# Patient Record
Sex: Female | Born: 1965
Health system: Southern US, Community
[De-identification: ages and names within clinical notes are randomized; demographics above are authoritative.]

## PROBLEM LIST (undated history)

## (undated) DIAGNOSIS — I8390 Asymptomatic varicose veins of unspecified lower extremity: Secondary | ICD-10-CM

## (undated) DIAGNOSIS — G43909 Migraine, unspecified, not intractable, without status migrainosus: Secondary | ICD-10-CM

## (undated) DIAGNOSIS — K219 Gastro-esophageal reflux disease without esophagitis: Secondary | ICD-10-CM

## (undated) DIAGNOSIS — K5792 Diverticulitis of intestine, part unspecified, without perforation or abscess without bleeding: Secondary | ICD-10-CM

## (undated) DIAGNOSIS — L130 Dermatitis herpetiformis: Secondary | ICD-10-CM

## (undated) DIAGNOSIS — J3489 Other specified disorders of nose and nasal sinuses: Secondary | ICD-10-CM

## (undated) DIAGNOSIS — N200 Calculus of kidney: Secondary | ICD-10-CM

## (undated) DIAGNOSIS — J309 Allergic rhinitis, unspecified: Secondary | ICD-10-CM

## (undated) DIAGNOSIS — E559 Vitamin D deficiency, unspecified: Secondary | ICD-10-CM

## (undated) DIAGNOSIS — T7840XA Allergy, unspecified, initial encounter: Secondary | ICD-10-CM

## (undated) DIAGNOSIS — J45909 Unspecified asthma, uncomplicated: Secondary | ICD-10-CM

## (undated) DIAGNOSIS — M2550 Pain in unspecified joint: Secondary | ICD-10-CM

## (undated) DIAGNOSIS — K5790 Diverticulosis of intestine, part unspecified, without perforation or abscess without bleeding: Secondary | ICD-10-CM

## (undated) DIAGNOSIS — E785 Hyperlipidemia, unspecified: Secondary | ICD-10-CM

## (undated) DIAGNOSIS — K589 Irritable bowel syndrome without diarrhea: Secondary | ICD-10-CM

## (undated) DIAGNOSIS — J40 Bronchitis, not specified as acute or chronic: Secondary | ICD-10-CM

## (undated) DIAGNOSIS — B009 Herpesviral infection, unspecified: Secondary | ICD-10-CM

## (undated) HISTORY — DX: Vitamin D deficiency, unspecified: E55.9

## (undated) HISTORY — DX: Calculus of kidney: N20.0

## (undated) HISTORY — DX: Hyperlipidemia, unspecified: E78.5

## (undated) HISTORY — PX: TUBAL LIGATION: SHX77

## (undated) HISTORY — DX: Unspecified asthma, uncomplicated: J45.909

## (undated) HISTORY — DX: Asymptomatic varicose veins of unspecified lower extremity: I83.90

## (undated) HISTORY — DX: Allergy, unspecified, initial encounter: T78.40XA

## (undated) HISTORY — DX: Herpesviral infection, unspecified: B00.9

## (undated) HISTORY — DX: Pain in unspecified joint: M25.50

## (undated) HISTORY — DX: Gastro-esophageal reflux disease without esophagitis: K21.9

## (undated) HISTORY — DX: Other specified disorders of nose and nasal sinuses: J34.89

## (undated) HISTORY — DX: Bronchitis, not specified as acute or chronic: J40

## (undated) HISTORY — PX: BREAST BIOPSY: SHX20

## (undated) HISTORY — DX: Diverticulosis of intestine, part unspecified, without perforation or abscess without bleeding: K57.90

## (undated) HISTORY — DX: Irritable bowel syndrome, unspecified: K58.9

## (undated) HISTORY — DX: Dermatitis herpetiformis: L13.0

## (undated) HISTORY — DX: Allergic rhinitis, unspecified: J30.9

---

## 1999-12-19 ENCOUNTER — Inpatient Hospital Stay (HOSPITAL_COMMUNITY): Admission: AD | Admit: 1999-12-19 | Discharge: 1999-12-22 | Payer: Self-pay | Admitting: Obstetrics & Gynecology

## 2000-01-11 ENCOUNTER — Encounter: Admission: RE | Admit: 2000-01-11 | Discharge: 2000-04-10 | Payer: Self-pay | Admitting: Obstetrics & Gynecology

## 2000-03-18 ENCOUNTER — Other Ambulatory Visit: Admission: RE | Admit: 2000-03-18 | Discharge: 2000-03-18 | Payer: Self-pay | Admitting: *Deleted

## 2000-04-24 ENCOUNTER — Encounter: Admission: RE | Admit: 2000-04-24 | Discharge: 2000-07-02 | Payer: Self-pay | Admitting: Obstetrics & Gynecology

## 2001-04-15 ENCOUNTER — Other Ambulatory Visit: Admission: RE | Admit: 2001-04-15 | Discharge: 2001-04-15 | Payer: Self-pay | Admitting: Obstetrics & Gynecology

## 2004-04-13 ENCOUNTER — Ambulatory Visit: Payer: Self-pay | Admitting: Family Medicine

## 2004-06-09 ENCOUNTER — Ambulatory Visit: Payer: Self-pay | Admitting: Family Medicine

## 2004-09-26 ENCOUNTER — Other Ambulatory Visit: Admission: RE | Admit: 2004-09-26 | Discharge: 2004-09-26 | Payer: Self-pay | Admitting: Obstetrics & Gynecology

## 2004-10-18 ENCOUNTER — Encounter: Admission: RE | Admit: 2004-10-18 | Discharge: 2004-10-18 | Payer: Self-pay | Admitting: General Surgery

## 2005-02-20 ENCOUNTER — Ambulatory Visit: Payer: Self-pay | Admitting: Family Medicine

## 2005-03-13 ENCOUNTER — Ambulatory Visit: Payer: Self-pay | Admitting: Family Medicine

## 2009-02-03 HISTORY — PX: COLONOSCOPY: SHX174

## 2011-05-23 HISTORY — PX: ESOPHAGOGASTRODUODENOSCOPY: SHX1529

## 2012-03-22 ENCOUNTER — Encounter (HOSPITAL_COMMUNITY): Payer: Self-pay | Admitting: *Deleted

## 2012-03-22 ENCOUNTER — Emergency Department (HOSPITAL_COMMUNITY)
Admission: EM | Admit: 2012-03-22 | Discharge: 2012-03-22 | Disposition: A | Discharge status: Home / Self Care | Payer: Federal, State, Local not specified - PPO | Attending: Emergency Medicine | Admitting: Emergency Medicine

## 2012-03-22 DIAGNOSIS — Z79899 Other long term (current) drug therapy: Secondary | ICD-10-CM | POA: Insufficient documentation

## 2012-03-22 DIAGNOSIS — M5430 Sciatica, unspecified side: Secondary | ICD-10-CM | POA: Insufficient documentation

## 2012-03-22 DIAGNOSIS — M5431 Sciatica, right side: Secondary | ICD-10-CM

## 2012-03-22 DIAGNOSIS — Z8719 Personal history of other diseases of the digestive system: Secondary | ICD-10-CM | POA: Insufficient documentation

## 2012-03-22 DIAGNOSIS — Z8679 Personal history of other diseases of the circulatory system: Secondary | ICD-10-CM | POA: Insufficient documentation

## 2012-03-22 HISTORY — DX: Migraine, unspecified, not intractable, without status migrainosus: G43.909

## 2012-03-22 HISTORY — DX: Diverticulitis of intestine, part unspecified, without perforation or abscess without bleeding: K57.92

## 2012-03-22 MED ORDER — DIAZEPAM 5 MG PO TABS: 5.0000 mg | ORAL_TABLET | Freq: Three times a day (TID) | ORAL | Status: DC | PRN

## 2012-03-22 MED ORDER — DEXAMETHASONE 2 MG PO TABS
10.0000 mg | ORAL_TABLET | Freq: Once | ORAL | Status: AC
  Administered 2012-03-22: 10 mg via ORAL
  Filled 2012-03-22: qty 5

## 2012-03-22 MED ORDER — IBUPROFEN 400 MG PO TABS
600.0000 mg | ORAL_TABLET | Freq: Once | ORAL | Status: AC
  Administered 2012-03-22: 600 mg via ORAL
  Filled 2012-03-22: qty 1

## 2012-03-22 MED ORDER — HYDROMORPHONE HCL PF 1 MG/ML IJ SOLN
1.0000 mg | Freq: Once | INTRAMUSCULAR | Status: AC
  Administered 2012-03-22: 1 mg via INTRAMUSCULAR
  Filled 2012-03-22: qty 1

## 2012-03-22 MED ORDER — DIAZEPAM 5 MG PO TABS
5.0000 mg | ORAL_TABLET | Freq: Once | ORAL | Status: AC
  Administered 2012-03-22: 5 mg via ORAL
  Filled 2012-03-22: qty 1

## 2012-03-22 MED ORDER — NAPROXEN 500 MG PO TABS: 500.0000 mg | ORAL_TABLET | Freq: Two times a day (BID) | ORAL | Status: DC | PRN

## 2012-03-22 MED ORDER — OXYCODONE-ACETAMINOPHEN 5-325 MG PO TABS: 1.0000 | ORAL_TABLET | ORAL | Status: DC | PRN

## 2012-03-22 MED ORDER — METHYLPREDNISOLONE 4 MG PO KIT: PACK | ORAL | Status: DC

## 2012-03-22 NOTE — ED Provider Notes (Signed)
History  This chart was scribed for Raeford Razor, MD by Ladona Ridgel Day, ED scribe. This patient was seen in room TR07C/TR07C and the patient's care was started at 1014.   CSN: [id number]  Arrival date & time 03/22/12  1014   First MD Initiated Contact with Patient 03/22/12 1113      Chief Complaint  Patient presents with  . Back Pain   Patient is a 46 y.o. female presenting with back pain. The history is provided by the patient. No language interpreter was used.  Back Pain  This is a new problem. The current episode started more than 2 days ago. The problem occurs constantly. The problem has been gradually worsening. The pain is associated with no known injury (years ago had accident white water rafting but did not have back pain until now). The pain is present in the lumbar spine. The quality of the pain is described as shooting. The pain radiates to the right thigh. The pain is moderate. The symptoms are aggravated by certain positions. The pain is the same all the time. Associated symptoms include leg pain. Pertinent negatives include no fever, no abdominal pain, no bowel incontinence, no bladder incontinence and no weakness. Treatments tried: had PT on thursday which did not help, also strated flexeril and toradol without relief. The treatment provided no relief.    Past Medical History  Diagnosis Date  . Diverticulitis   . Migraine     No past surgical history on file.  History reviewed. No pertinent family history.  History  Substance Use Topics  . Smoking status: Never Smoker   . Smokeless tobacco: Not on file  . Alcohol Use: No    OB History    Grav Para Term Preterm Abortions TAB SAB Ect Mult Living                  Review of Systems  Constitutional: Negative for fever and chills.  HENT: Negative for neck pain.   Respiratory: Negative for shortness of breath.   Gastrointestinal: Negative for nausea, vomiting, abdominal pain and bowel incontinence.  Genitourinary:  Negative for bladder incontinence.  Musculoskeletal: Positive for back pain (lumbar back pain which radiates down her right leg, worse with walking).  Skin: Negative for color change.  Neurological: Negative for weakness.  All other systems reviewed and are negative.    Allergies  Penicillins  Home Medications   Current Outpatient Rx  Name  Route  Sig  Dispense  Refill  . CETIRIZINE HCL 10 MG PO TABS   Oral   Take 10 mg by mouth daily.         . CHOLECALCIFEROL 400 UNITS PO TABS   Oral   Take 400 Units by mouth daily.         . CYCLOBENZAPRINE HCL 5 MG PO TABS   Oral   Take 5 mg by mouth daily as needed. For pain         . ESOMEPRAZOLE MAGNESIUM 40 MG PO CPDR   Oral   Take 40 mg by mouth daily before breakfast.         . KETOROLAC TROMETHAMINE 10 MG PO TABS   Oral   Take 10 mg by mouth every 6 (six) hours as needed. For pain         . MAGNESIUM GLUCONATE 500 MG PO TABS   Oral   Take 500 mg by mouth daily.         Marland Kitchen PROBIOTIC DAILY PO  Oral   Take 1 capsule by mouth daily.           Triage vitals: BP 149/75  Pulse 77  Temp 98.3 F (36.8 C)  Resp 22  SpO2 100%  Physical Exam  Nursing note and vitals reviewed. Constitutional: She appears well-developed and well-nourished. No distress.  HENT:  Head: Normocephalic and atraumatic.  Eyes: Conjunctivae normal are normal. Right eye exhibits no discharge. Left eye exhibits no discharge.  Neck: Neck supple.  Cardiovascular: Normal rate, regular rhythm and normal heart sounds.  Exam reveals no gallop and no friction rub.   No murmur heard. Pulmonary/Chest: Effort normal and breath sounds normal. No respiratory distress.  Abdominal: Soft. She exhibits no distension. There is no tenderness.  Musculoskeletal: She exhibits tenderness. She exhibits no edema.       No midline spinal tenderness. Left lumbar paraspinal tenderness along with buttock and left posterior lateral thigh. Positive SLR, NVI  distally.   Neurological: She is alert. She displays normal reflexes.  Skin: Skin is warm and dry.  Psychiatric: She has a normal mood and affect. Her behavior is normal. Thought content normal.    ED Course  Procedures (including critical care time) DIAGNOSTIC STUDIES: Oxygen Saturation is 100% on room air, normal by my interpretation.    COORDINATION OF CARE: At 1135 AM Discussed treatment plan with patient which includes pain medicine and valium. Patient agrees.   Labs Reviewed - No data to display No results found.   1. Sciatica of right side       MDM  46yF with back pain. No concerning "red flags." Nonfocal neuro exam. No indication for emergent imaging. Plan symptomatic tx. Return precautions discussed. Outpt fu.  I personally preformed the services scribed in my presence. The recorded information has been reviewed and is accurate. Raeford Razor, MD.         Raeford Razor, MD 03/22/12 [phone]

## 2012-03-22 NOTE — ED Notes (Signed)
Patient with lower back pain that started on Sunday and has progressively gotten worse.  Pain radiates to the right side of her leg.

## 2012-03-22 NOTE — ED Notes (Signed)
PTreports back pain to be 10/10 since Thursday. Pt's PCP started meds on Thursday flexeril ,toradol and PCP gave PT a steroid  Injection.

## 2015-09-27 DIAGNOSIS — K080 Exfoliation of teeth due to systemic causes: Secondary | ICD-10-CM | POA: Diagnosis not present

## 2015-10-04 DIAGNOSIS — H40003 Preglaucoma, unspecified, bilateral: Secondary | ICD-10-CM | POA: Diagnosis not present

## 2015-10-04 DIAGNOSIS — H524 Presbyopia: Secondary | ICD-10-CM | POA: Diagnosis not present

## 2015-10-07 DIAGNOSIS — E669 Obesity, unspecified: Secondary | ICD-10-CM | POA: Diagnosis not present

## 2015-10-07 DIAGNOSIS — Z23 Encounter for immunization: Secondary | ICD-10-CM | POA: Diagnosis not present

## 2015-10-07 DIAGNOSIS — Z0000 Encounter for general adult medical examination without abnormal findings: Secondary | ICD-10-CM | POA: Diagnosis not present

## 2015-10-07 DIAGNOSIS — Z1212 Encounter for screening for malignant neoplasm of rectum: Secondary | ICD-10-CM | POA: Diagnosis not present

## 2015-10-07 DIAGNOSIS — E559 Vitamin D deficiency, unspecified: Secondary | ICD-10-CM | POA: Diagnosis not present

## 2015-10-07 DIAGNOSIS — Z1211 Encounter for screening for malignant neoplasm of colon: Secondary | ICD-10-CM | POA: Diagnosis not present

## 2015-10-18 DIAGNOSIS — G43909 Migraine, unspecified, not intractable, without status migrainosus: Secondary | ICD-10-CM | POA: Diagnosis not present

## 2015-10-18 DIAGNOSIS — E559 Vitamin D deficiency, unspecified: Secondary | ICD-10-CM | POA: Diagnosis not present

## 2015-10-18 DIAGNOSIS — E785 Hyperlipidemia, unspecified: Secondary | ICD-10-CM | POA: Diagnosis not present

## 2015-10-18 DIAGNOSIS — E669 Obesity, unspecified: Secondary | ICD-10-CM | POA: Diagnosis not present

## 2015-12-24 DIAGNOSIS — G43909 Migraine, unspecified, not intractable, without status migrainosus: Secondary | ICD-10-CM | POA: Diagnosis not present

## 2016-02-15 DIAGNOSIS — Z1231 Encounter for screening mammogram for malignant neoplasm of breast: Secondary | ICD-10-CM | POA: Diagnosis not present

## 2016-02-15 DIAGNOSIS — M5416 Radiculopathy, lumbar region: Secondary | ICD-10-CM | POA: Diagnosis not present

## 2016-02-15 DIAGNOSIS — M5126 Other intervertebral disc displacement, lumbar region: Secondary | ICD-10-CM | POA: Diagnosis not present

## 2016-02-15 DIAGNOSIS — Z6832 Body mass index (BMI) 32.0-32.9, adult: Secondary | ICD-10-CM | POA: Diagnosis not present

## 2016-02-17 DIAGNOSIS — M5136 Other intervertebral disc degeneration, lumbar region: Secondary | ICD-10-CM | POA: Diagnosis not present

## 2016-02-17 DIAGNOSIS — M5126 Other intervertebral disc displacement, lumbar region: Secondary | ICD-10-CM | POA: Diagnosis not present

## 2016-02-17 DIAGNOSIS — E785 Hyperlipidemia, unspecified: Secondary | ICD-10-CM | POA: Diagnosis not present

## 2016-02-17 DIAGNOSIS — Z87898 Personal history of other specified conditions: Secondary | ICD-10-CM | POA: Diagnosis not present

## 2016-02-17 DIAGNOSIS — E559 Vitamin D deficiency, unspecified: Secondary | ICD-10-CM | POA: Diagnosis not present

## 2016-02-17 DIAGNOSIS — E669 Obesity, unspecified: Secondary | ICD-10-CM | POA: Diagnosis not present

## 2016-02-21 DIAGNOSIS — M5126 Other intervertebral disc displacement, lumbar region: Secondary | ICD-10-CM | POA: Diagnosis not present

## 2016-03-12 DIAGNOSIS — H40003 Preglaucoma, unspecified, bilateral: Secondary | ICD-10-CM | POA: Diagnosis not present

## 2016-04-03 DIAGNOSIS — M5126 Other intervertebral disc displacement, lumbar region: Secondary | ICD-10-CM | POA: Diagnosis not present

## 2016-04-04 DIAGNOSIS — Z79899 Other long term (current) drug therapy: Secondary | ICD-10-CM | POA: Diagnosis not present

## 2016-04-04 DIAGNOSIS — K080 Exfoliation of teeth due to systemic causes: Secondary | ICD-10-CM | POA: Diagnosis not present

## 2016-04-04 DIAGNOSIS — Z23 Encounter for immunization: Secondary | ICD-10-CM | POA: Diagnosis not present

## 2016-05-11 DIAGNOSIS — J039 Acute tonsillitis, unspecified: Secondary | ICD-10-CM | POA: Diagnosis not present

## 2016-05-11 DIAGNOSIS — J029 Acute pharyngitis, unspecified: Secondary | ICD-10-CM | POA: Diagnosis not present

## 2016-05-11 DIAGNOSIS — R05 Cough: Secondary | ICD-10-CM | POA: Diagnosis not present

## 2016-05-11 DIAGNOSIS — J209 Acute bronchitis, unspecified: Secondary | ICD-10-CM | POA: Diagnosis not present

## 2016-05-31 DIAGNOSIS — Z79899 Other long term (current) drug therapy: Secondary | ICD-10-CM | POA: Diagnosis not present

## 2016-05-31 DIAGNOSIS — E559 Vitamin D deficiency, unspecified: Secondary | ICD-10-CM | POA: Diagnosis not present

## 2016-05-31 DIAGNOSIS — E785 Hyperlipidemia, unspecified: Secondary | ICD-10-CM | POA: Diagnosis not present

## 2016-05-31 DIAGNOSIS — J301 Allergic rhinitis due to pollen: Secondary | ICD-10-CM | POA: Diagnosis not present

## 2016-05-31 DIAGNOSIS — G43909 Migraine, unspecified, not intractable, without status migrainosus: Secondary | ICD-10-CM | POA: Diagnosis not present

## 2016-09-12 ENCOUNTER — Encounter: Payer: Self-pay | Admitting: Sports Medicine

## 2016-09-12 ENCOUNTER — Ambulatory Visit (INDEPENDENT_AMBULATORY_CARE_PROVIDER_SITE_OTHER): Payer: Federal, State, Local not specified - PPO

## 2016-09-12 ENCOUNTER — Ambulatory Visit (INDEPENDENT_AMBULATORY_CARE_PROVIDER_SITE_OTHER): Payer: Federal, State, Local not specified - PPO | Admitting: Sports Medicine

## 2016-09-12 DIAGNOSIS — M722 Plantar fascial fibromatosis: Secondary | ICD-10-CM

## 2016-09-12 DIAGNOSIS — R52 Pain, unspecified: Secondary | ICD-10-CM

## 2016-09-12 MED ORDER — TRIAMCINOLONE ACETONIDE 10 MG/ML IJ SUSP: 10.0000 mg | Freq: Once | INTRAMUSCULAR | Status: AC

## 2016-09-12 MED ORDER — METHYLPREDNISOLONE 4 MG PO TBPK: ORAL_TABLET | ORAL | 0 refills | Status: DC

## 2016-09-12 NOTE — Progress Notes (Signed)
Subjective: Brittany Thomas is a 51 y.o. female patient presents to office with complaint of heel pain on the right. Patient admits to post static dyskinesia for 2 weeks in duration. Patient has treated this problem with cushions, changing shoes, stretching with no relief. Admits to a past history of achilles tendonitis on right. Denies any other pedal complaints.   WORKS AT [employer] OFF[employer].  There are no active problems to display for this patient.   Current Outpatient Prescriptions on File Prior to Visit  Medication Sig Dispense Refill  . cetirizine (ZYRTEC) 10 MG tablet Take 10 mg by mouth daily.    . cholecalciferol (VITAMIN D) 400 UNITS TABS Take 400 Units by mouth daily.    . cyclobenzaprine (FLEXERIL) 5 MG tablet Take 5 mg by mouth daily as needed. For pain    . diazepam (VALIUM) 5 MG tablet Take 1 tablet (5 mg total) by mouth every 8 (eight) hours as needed (muscle spasm). (Patient not taking: Reported on 09/12/2016) 12 tablet 0  . esomeprazole (NEXIUM) 40 MG capsule Take 40 mg by mouth daily before breakfast.    . ketorolac (TORADOL) 10 MG tablet Take 10 mg by mouth every 6 (six) hours as needed. For pain    . magnesium gluconate (MAGONATE) 500 MG tablet Take 500 mg by mouth daily.    . naproxen (NAPROSYN) 500 MG tablet Take 1 tablet (500 mg total) by mouth 2 (two) times daily as needed. (Patient not taking: Reported on 09/12/2016) 30 tablet 0  . oxyCODONE-acetaminophen (PERCOCET/ROXICET) 5-325 MG per tablet Take 1-2 tablets by mouth every 4 (four) hours as needed for pain. (Patient not taking: Reported on 09/12/2016) 12 tablet 0  . Probiotic Product (PROBIOTIC DAILY PO) Take 1 capsule by mouth daily.     No current facility-administered medications on file prior to visit.     Allergies  Allergen Reactions  . Penicillins     rash    Objective: Physical Exam General: The patient is alert and oriented x3 in no acute distress.  Dermatology: Skin is warm, dry and supple bilateral lower  extremities. Nails 1-10 are normal. There is no erythema, edema, no eccymosis, no open lesions present. Integument is otherwise unremarkable.  Vascular: Dorsalis Pedis pulse and Posterior Tibial pulse are 2/4 bilateral. Capillary fill time is immediate to all digits.  Neurological: Grossly intact to light touch with an achilles reflex of +2/5 and a  negative Tinel's sign bilateral.  Musculoskeletal: Tenderness to palpation at the medial calcaneal tubercale and through the insertion of the plantar fascia on the right foot. No pain with compression of calcaneus bilateral. No pain with tuning fork to calcaneus bilateral. No pain with calf compression bilateral. There is decreased Ankle joint range of motion bilateral. All other joints range of motion within normal limits bilateral. Strength 5/5 in all groups bilateral.   Gait: Unassisted, Antalgic avoid weight on right heel  Xray, Right foot:  Normal osseous mineralization. Joint spaces preserved. No fracture/dislocation/boney destruction. Calcaneal spur present with mild thickening of plantar fascia. No other soft tissue abnormalities or radiopaque foreign bodies.   Assessment and Plan: Problem List Items Addressed This Visit    None    Visit Diagnoses    Plantar fasciitis, right    -  Primary   Relevant Medications   triamcinolone acetonide (KENALOG) 10 MG/ML injection 10 mg   Pain       Relevant Orders   DG Foot Complete Right      -Complete examination  performed.  -Xrays reviewed -Discussed with patient in detail the condition of plantar fasciitis, how this occurs and general treatment options. Explained both conservative and surgical treatments.  -After oral consent and aseptic prep, injected a mixture containing 1 ml of 2%  plain lidocaine, 1 ml 0.5% plain marcaine, 0.5 ml of kenalog 10 and 0.5 ml of dexamethasone phosphate into right heel. Post-injection care discussed with patient.  -Rx Medrol dose pack to take as instructed   -May continue with Tylenol with pain since has difficulty with tolerating other NSAIDS -Recommended good supportive shoes and advised use of OTC insert. Explained to patient that if these orthoses work well, we will continue with these. If these do not improve her condition and  pain, we will consider custom molded orthoses. - Explained in detail the heel lift dispensed for the right foot -Explained and dispensed to patient daily stretching exercises. -Recommend patient to ice affected area 1-2x daily. -Patient to return to office in 3 weeks for follow up or sooner if problems or questions arise.  Landis Martins, DPM

## 2016-09-12 NOTE — Progress Notes (Signed)
   Subjective:    Patient ID: Brittany Thomas, female    DOB: [birth year], 51 y.o.   MRN: [id number]  HPI   I have a knot on the arch of my right foot and just noticed it today and has been going on for about two weeks     Review of Systems  Neurological: Positive for headaches.  All other systems reviewed and are negative.      Objective:   Physical Exam        Assessment & Plan:

## 2016-10-03 ENCOUNTER — Ambulatory Visit: Payer: Federal, State, Local not specified - PPO | Admitting: Sports Medicine

## 2016-10-03 DIAGNOSIS — M722 Plantar fascial fibromatosis: Secondary | ICD-10-CM | POA: Diagnosis not present

## 2016-10-03 DIAGNOSIS — R52 Pain, unspecified: Secondary | ICD-10-CM | POA: Diagnosis not present

## 2016-10-03 MED ORDER — METHYLPREDNISOLONE 4 MG PO TBPK: ORAL_TABLET | ORAL | 0 refills | Status: DC

## 2016-10-03 NOTE — Progress Notes (Signed)
Subjective: Brittany Thomas is a 51 y.o. female returns to office for follow up evaluation after Right heel injection for plantar fasciitis, injection #1 administered 3 weeks ago. Patient states that the injection seems to help her pain for about 1 week, but now pain is back to where it was. Patient denies any recent changes in medications or other new problems since last visit.  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   Current Outpatient Prescriptions on File Prior to Visit  Medication Sig Dispense Refill  . acetaminophen (TYLENOL) 500 MG tablet Take 500 mg by mouth every 6 (six) hours as needed.    . cetirizine (ZYRTEC) 10 MG tablet Take 10 mg by mouth daily.    . cholecalciferol (VITAMIN D) 400 UNITS TABS Take 400 Units by mouth daily.    . cyclobenzaprine (FLEXERIL) 5 MG tablet Take 5 mg by mouth daily as needed. For pain    . diazepam (VALIUM) 5 MG tablet Take 1 tablet (5 mg total) by mouth every 8 (eight) hours as needed (muscle spasm). (Patient not taking: Reported on 09/12/2016) 12 tablet 0  . esomeprazole (NEXIUM) 40 MG capsule Take 40 mg by mouth daily before breakfast.    . ketorolac (TORADOL) 10 MG tablet Take 10 mg by mouth every 6 (six) hours as needed. For pain    . loratadine (CLARITIN) 10 MG tablet Take 10 mg by mouth daily.    . magnesium gluconate (MAGONATE) 500 MG tablet Take 500 mg by mouth daily.    . montelukast (SINGULAIR) 10 MG tablet     . naproxen (NAPROSYN) 500 MG tablet Take 1 tablet (500 mg total) by mouth 2 (two) times daily as needed. (Patient not taking: Reported on 09/12/2016) 30 tablet 0  . oxyCODONE-acetaminophen (PERCOCET/ROXICET) 5-325 MG per tablet Take 1-2 tablets by mouth every 4 (four) hours as needed for pain. (Patient not taking: Reported on 09/12/2016) 12 tablet 0  . Probiotic Product (PROBIOTIC DAILY PO) Take 1 capsule by mouth daily.    . propranolol (INDERAL) 60 MG tablet Take  60 mg by mouth 3 (three) times daily.     Current Facility-Administered Medications on File Prior to Visit  Medication Dose Route Frequency Provider Last Rate Last Dose  . triamcinolone acetonide (KENALOG) 10 MG/ML injection 10 mg  10 mg Other Once Landis Martins, DPM        Allergies  Allergen Reactions  . Penicillins     rash    Objective:   General:  Alert and oriented x 3, in no acute distress  Dermatology: Skin is warm, dry, and supple bilateral. Nails are within normal limits. There is no lower extremity erythema, no eccymosis, no open lesions present bilateral.   Vascular: Dorsalis Pedis and Posterior Tibial pedal pulses are 2/4 bilateral. + hair growth noted bilateral. Capillary Fill Time is 3 seconds in all digits. No varicosities, No edema bilateral lower extremities.   Neurological: Sensation grossly intact to light touch with an achilles reflex of +2 and a  negative Tinel's sign bilateral. Vibratory, sharp/dull, Semmes Weinstein Monofilament within normal limits.   Musculoskeletal: There is Moderate tenderness to palpation at the medial calcaneal tubercale and through the insertion of the plantar fascia on the right foot and now compensation lateral ankle pain. No pain with compression to calcaneus or application of tuning fork. There is decreased Ankle joint range of motion bilateral. All other jointsrange  of motion  within normal limits bilateral. Strength 5/5 bilateral.   Assessment and Plan: Problem List Items Addressed This Visit    None    Visit Diagnoses    Plantar fasciitis, right    -  Primary   Relevant Medications   methylPREDNISolone (MEDROL DOSEPAK) 4 MG TBPK tablet   Pain       Relevant Medications   methylPREDNISolone (MEDROL DOSEPAK) 4 MG TBPK tablet      -Complete examination performed.  -Previous x-rays reviewed. -Discussed with patient in detail the condition of plantar fasciitis, how this  occurs related to the foot type of the patient and  general treatment options. - Patient refused a second injection today and refused CAM boot -Dispensed right fascial brace -Rx again Medrol Dosepak -Continue with stretching, icing, good supportive shoes, inserts daily.  -Discussed long term care and reocurrence; will closely monitor; if fails to improve will consider other treatment modalities.  -Patient to return to office in 2 weeks for follow up or sooner if problems or questions arise.  Landis Martins, DPM

## 2016-10-09 DIAGNOSIS — K080 Exfoliation of teeth due to systemic causes: Secondary | ICD-10-CM | POA: Diagnosis not present

## 2016-11-02 ENCOUNTER — Ambulatory Visit: Payer: Federal, State, Local not specified - PPO | Admitting: Sports Medicine

## 2016-11-26 DIAGNOSIS — E669 Obesity, unspecified: Secondary | ICD-10-CM | POA: Diagnosis not present

## 2016-11-26 DIAGNOSIS — Z1211 Encounter for screening for malignant neoplasm of colon: Secondary | ICD-10-CM | POA: Diagnosis not present

## 2016-11-26 DIAGNOSIS — E559 Vitamin D deficiency, unspecified: Secondary | ICD-10-CM | POA: Diagnosis not present

## 2016-11-26 DIAGNOSIS — Z1389 Encounter for screening for other disorder: Secondary | ICD-10-CM | POA: Diagnosis not present

## 2016-11-26 DIAGNOSIS — Z1212 Encounter for screening for malignant neoplasm of rectum: Secondary | ICD-10-CM | POA: Diagnosis not present

## 2016-11-26 DIAGNOSIS — Z0000 Encounter for general adult medical examination without abnormal findings: Secondary | ICD-10-CM | POA: Diagnosis not present

## 2017-01-02 DIAGNOSIS — H40003 Preglaucoma, unspecified, bilateral: Secondary | ICD-10-CM | POA: Diagnosis not present

## 2017-01-02 DIAGNOSIS — Z124 Encounter for screening for malignant neoplasm of cervix: Secondary | ICD-10-CM | POA: Diagnosis not present

## 2017-01-02 DIAGNOSIS — L900 Lichen sclerosus et atrophicus: Secondary | ICD-10-CM | POA: Diagnosis not present

## 2017-01-02 DIAGNOSIS — Z6832 Body mass index (BMI) 32.0-32.9, adult: Secondary | ICD-10-CM | POA: Diagnosis not present

## 2017-01-02 DIAGNOSIS — L292 Pruritus vulvae: Secondary | ICD-10-CM | POA: Diagnosis not present

## 2017-01-02 DIAGNOSIS — Z01411 Encounter for gynecological examination (general) (routine) with abnormal findings: Secondary | ICD-10-CM | POA: Diagnosis not present

## 2017-01-02 DIAGNOSIS — Z1231 Encounter for screening mammogram for malignant neoplasm of breast: Secondary | ICD-10-CM | POA: Diagnosis not present

## 2017-02-25 DIAGNOSIS — M722 Plantar fascial fibromatosis: Secondary | ICD-10-CM | POA: Diagnosis not present

## 2017-02-25 DIAGNOSIS — Z23 Encounter for immunization: Secondary | ICD-10-CM | POA: Diagnosis not present

## 2017-03-08 DIAGNOSIS — M25571 Pain in right ankle and joints of right foot: Secondary | ICD-10-CM | POA: Diagnosis not present

## 2017-03-08 DIAGNOSIS — M722 Plantar fascial fibromatosis: Secondary | ICD-10-CM | POA: Diagnosis not present

## 2017-03-12 DIAGNOSIS — M722 Plantar fascial fibromatosis: Secondary | ICD-10-CM | POA: Diagnosis not present

## 2017-03-12 DIAGNOSIS — M25571 Pain in right ankle and joints of right foot: Secondary | ICD-10-CM | POA: Diagnosis not present

## 2017-03-15 DIAGNOSIS — M25571 Pain in right ankle and joints of right foot: Secondary | ICD-10-CM | POA: Diagnosis not present

## 2017-03-15 DIAGNOSIS — M722 Plantar fascial fibromatosis: Secondary | ICD-10-CM | POA: Diagnosis not present

## 2017-03-18 DIAGNOSIS — M25571 Pain in right ankle and joints of right foot: Secondary | ICD-10-CM | POA: Diagnosis not present

## 2017-03-18 DIAGNOSIS — M722 Plantar fascial fibromatosis: Secondary | ICD-10-CM | POA: Diagnosis not present

## 2017-03-22 DIAGNOSIS — M25571 Pain in right ankle and joints of right foot: Secondary | ICD-10-CM | POA: Diagnosis not present

## 2017-03-22 DIAGNOSIS — M722 Plantar fascial fibromatosis: Secondary | ICD-10-CM | POA: Diagnosis not present

## 2017-05-09 DIAGNOSIS — K080 Exfoliation of teeth due to systemic causes: Secondary | ICD-10-CM | POA: Diagnosis not present

## 2017-07-02 DIAGNOSIS — G43909 Migraine, unspecified, not intractable, without status migrainosus: Secondary | ICD-10-CM | POA: Diagnosis not present

## 2017-07-02 DIAGNOSIS — E559 Vitamin D deficiency, unspecified: Secondary | ICD-10-CM | POA: Diagnosis not present

## 2017-07-02 DIAGNOSIS — E785 Hyperlipidemia, unspecified: Secondary | ICD-10-CM | POA: Diagnosis not present

## 2017-11-12 DIAGNOSIS — K080 Exfoliation of teeth due to systemic causes: Secondary | ICD-10-CM | POA: Diagnosis not present

## 2017-11-26 DIAGNOSIS — L237 Allergic contact dermatitis due to plants, except food: Secondary | ICD-10-CM | POA: Diagnosis not present

## 2017-11-26 DIAGNOSIS — L299 Pruritus, unspecified: Secondary | ICD-10-CM | POA: Diagnosis not present

## 2017-12-30 DIAGNOSIS — Z1331 Encounter for screening for depression: Secondary | ICD-10-CM | POA: Diagnosis not present

## 2017-12-30 DIAGNOSIS — E559 Vitamin D deficiency, unspecified: Secondary | ICD-10-CM | POA: Diagnosis not present

## 2017-12-30 DIAGNOSIS — G43909 Migraine, unspecified, not intractable, without status migrainosus: Secondary | ICD-10-CM | POA: Diagnosis not present

## 2017-12-30 DIAGNOSIS — E669 Obesity, unspecified: Secondary | ICD-10-CM | POA: Diagnosis not present

## 2017-12-30 DIAGNOSIS — E785 Hyperlipidemia, unspecified: Secondary | ICD-10-CM | POA: Diagnosis not present

## 2018-01-15 DIAGNOSIS — Z6832 Body mass index (BMI) 32.0-32.9, adult: Secondary | ICD-10-CM | POA: Diagnosis not present

## 2018-01-15 DIAGNOSIS — Z1231 Encounter for screening mammogram for malignant neoplasm of breast: Secondary | ICD-10-CM | POA: Diagnosis not present

## 2018-01-15 DIAGNOSIS — Z01419 Encounter for gynecological examination (general) (routine) without abnormal findings: Secondary | ICD-10-CM | POA: Diagnosis not present

## 2018-01-29 DIAGNOSIS — R51 Headache: Secondary | ICD-10-CM | POA: Diagnosis not present

## 2018-02-17 DIAGNOSIS — H52222 Regular astigmatism, left eye: Secondary | ICD-10-CM | POA: Diagnosis not present

## 2018-02-17 DIAGNOSIS — H40003 Preglaucoma, unspecified, bilateral: Secondary | ICD-10-CM | POA: Diagnosis not present

## 2018-02-17 DIAGNOSIS — H5213 Myopia, bilateral: Secondary | ICD-10-CM | POA: Diagnosis not present

## 2018-04-18 ENCOUNTER — Ambulatory Visit: Payer: Federal, State, Local not specified - PPO | Admitting: Sports Medicine

## 2018-04-18 ENCOUNTER — Encounter: Payer: Self-pay | Admitting: Sports Medicine

## 2018-04-18 ENCOUNTER — Ambulatory Visit (INDEPENDENT_AMBULATORY_CARE_PROVIDER_SITE_OTHER): Payer: Federal, State, Local not specified - PPO

## 2018-04-18 DIAGNOSIS — M7732 Calcaneal spur, left foot: Secondary | ICD-10-CM

## 2018-04-18 DIAGNOSIS — M79672 Pain in left foot: Secondary | ICD-10-CM

## 2018-04-18 DIAGNOSIS — M7662 Achilles tendinitis, left leg: Secondary | ICD-10-CM

## 2018-04-18 MED ORDER — DICLOFENAC EPOLAMINE 1.3 % TD PTCH: MEDICATED_PATCH | TRANSDERMAL | 1 refills | Status: DC

## 2018-04-18 NOTE — Progress Notes (Signed)
Subjective: Brittany Thomas is a 52 y.o. female patient who presents to office for evaluation of left heel pain. Patient complains of progressive pain especially over the last 6 months at the back of her heel states the pain is sharp constant pain difficult to rest her heel down pain is 7 out of 10 states the pain is worse with standing all day and now that she has returned back to work after being out for 2 weeks helping to take care of her father who has moved in with her.  Patient has tried Tylenol with no relief in symptoms. Patient denies any other pedal complaints.   There are no active problems to display for this patient.   Current Outpatient Medications on File Prior to Visit  Medication Sig Dispense Refill  . acetaminophen (TYLENOL) 500 MG tablet Take 500 mg by mouth every 6 (six) hours as needed.    Marland Kitchen amitriptyline (ELAVIL) 10 MG tablet amitriptyline 10 mg tablet    . cetirizine (ZYRTEC) 10 MG tablet Take 10 mg by mouth daily.    . cholecalciferol (VITAMIN D) 400 UNITS TABS Take 400 Units by mouth daily.    . cyclobenzaprine (FLEXERIL) 5 MG tablet Take 5 mg by mouth daily as needed. For pain    . diazepam (VALIUM) 5 MG tablet Take 1 tablet (5 mg total) by mouth every 8 (eight) hours as needed (muscle spasm). 12 tablet 0  . esomeprazole (NEXIUM) 40 MG capsule Take 40 mg by mouth daily before breakfast.    . gabapentin (NEURONTIN) 100 MG capsule gabapentin 100 mg capsule    . ibuprofen (ADVIL,MOTRIN) 600 MG tablet ibuprofen 600 mg tablet    . ketorolac (TORADOL) 10 MG tablet Take 10 mg by mouth every 6 (six) hours as needed. For pain    . loratadine (CLARITIN) 10 MG tablet Take 10 mg by mouth daily.    . magnesium gluconate (MAGONATE) 500 MG tablet Take 500 mg by mouth daily.    . meloxicam (MOBIC) 7.5 MG tablet meloxicam 7.5 mg tablet    . methylPREDNISolone (MEDROL DOSEPAK) 4 MG TBPK tablet Take as instructed. 21 tablet 0  . metroNIDAZOLE (FLAGYL) 500 MG tablet metronidazole 500 mg  tablet    . montelukast (SINGULAIR) 10 MG tablet     . naproxen (NAPROSYN) 500 MG tablet Take 1 tablet (500 mg total) by mouth 2 (two) times daily as needed. 30 tablet 0  . oxyCODONE-acetaminophen (PERCOCET/ROXICET) 5-325 MG per tablet Take 1-2 tablets by mouth every 4 (four) hours as needed for pain. 12 tablet 0  . Probiotic Product (PROBIOTIC DAILY PO) Take 1 capsule by mouth daily.    . promethazine (PHENERGAN) 25 MG tablet promethazine 25 mg tablet    . propranolol (INDERAL) 60 MG tablet Take 60 mg by mouth 3 (three) times daily.    . SUMAtriptan (IMITREX) 100 MG tablet sumatriptan 100 mg tablet    . triamcinolone cream (KENALOG) 0.1 % triamcinolone acetonide 0.1 % topical cream    . valACYclovir (VALTREX) 1000 MG tablet valacyclovir 1 gram tablet     Current Facility-Administered Medications on File Prior to Visit  Medication Dose Route Frequency Provider Last Rate Last Dose  . triamcinolone acetonide (KENALOG) 10 MG/ML injection 10 mg  10 mg Other Once Landis Martins, DPM        Allergies  Allergen Reactions  . Penicillins     rash    Objective:  General: Alert and oriented x3 in no acute distress  Dermatology: No open lesions bilateral lower extremities, no webspace macerations, no ecchymosis bilateral, all nails x 10 are well manicured.  Vascular: Dorsalis Pedis and Posterior Tibial pedal pulses 2/4, Capillary Fill Time 3 seconds, + pedal hair growth bilateral, no edema bilateral lower extremities, Temperature gradient within normal limits.  Neurology: Johney Maine sensation intact via light touch bilateral.  Musculoskeletal: Moderate tenderness with palpation at insertion of the Achilles on left, there is calcaneal exostosis with mild soft tissue present and decreased ankle rom with knee extending  vs flexed resembling gastroc equnius bilateral, The achilles tendon feels intact with no nodularity or palpable dell, Thompson sign negative.  Gait: Antalgic gait with increased heel  off left  Xrays  Left Foot    Impression: Normal osseous mineralization. Joint spaces preserved. No fracture/dislocation/boney destruction.  Significant partially fractured calcaneal spur present. Kager's triangle intact with no obliteration. No soft tissue abnormalities or radiopaque foreign bodies.   Assessment and Plan: Problem List Items Addressed This Visit    None    Visit Diagnoses    Tendonitis, Achilles, left    -  Primary   Relevant Medications   diclofenac (FLECTOR) 1.3 % PTCH   Left foot pain       Relevant Orders   DG Foot Complete Left   Heel spur, left          -Complete examination performed -Xrays reviewed -Discussed treatement options for Achilles tendinitis with heel spur -Patient declined cam boot at this visit and declined oral medications (Medrol Dosepak and Mobic) -Prescribed topical Flector patch for patient to use as instructed -Recommend rest ice elevation and to wear Achilles sleeve when in shoes to prevent rubbing to the back of her heel with heel lifts as I provided at today's visit -No improvement will consider MRI/PT/EPAT -Patient to return to office if symptoms are not better within 2 weeks or sooner if condition worsens.  Advised patient that she may also call as well if she changes her mind about the oral medications for me to send to her pharmacy.  Landis Martins, DPM

## 2018-04-25 ENCOUNTER — Other Ambulatory Visit: Payer: Self-pay | Admitting: Sports Medicine

## 2018-04-25 DIAGNOSIS — M7662 Achilles tendinitis, left leg: Secondary | ICD-10-CM

## 2018-04-25 DIAGNOSIS — M79672 Pain in left foot: Secondary | ICD-10-CM

## 2018-04-25 DIAGNOSIS — M7732 Calcaneal spur, left foot: Secondary | ICD-10-CM

## 2018-05-21 DIAGNOSIS — K080 Exfoliation of teeth due to systemic causes: Secondary | ICD-10-CM | POA: Diagnosis not present

## 2018-07-01 DIAGNOSIS — E785 Hyperlipidemia, unspecified: Secondary | ICD-10-CM | POA: Diagnosis not present

## 2018-07-01 DIAGNOSIS — J329 Chronic sinusitis, unspecified: Secondary | ICD-10-CM | POA: Diagnosis not present

## 2018-07-01 DIAGNOSIS — G43909 Migraine, unspecified, not intractable, without status migrainosus: Secondary | ICD-10-CM | POA: Diagnosis not present

## 2018-07-01 DIAGNOSIS — E559 Vitamin D deficiency, unspecified: Secondary | ICD-10-CM | POA: Diagnosis not present

## 2018-07-01 DIAGNOSIS — Z131 Encounter for screening for diabetes mellitus: Secondary | ICD-10-CM | POA: Diagnosis not present

## 2018-07-15 ENCOUNTER — Encounter: Payer: Self-pay | Admitting: Family Medicine

## 2018-09-24 DIAGNOSIS — N390 Urinary tract infection, site not specified: Secondary | ICD-10-CM | POA: Diagnosis not present

## 2018-11-11 DIAGNOSIS — H40023 Open angle with borderline findings, high risk, bilateral: Secondary | ICD-10-CM | POA: Diagnosis not present

## 2018-12-17 DIAGNOSIS — M25572 Pain in left ankle and joints of left foot: Secondary | ICD-10-CM | POA: Diagnosis not present

## 2018-12-17 DIAGNOSIS — M25562 Pain in left knee: Secondary | ICD-10-CM | POA: Diagnosis not present

## 2018-12-19 DIAGNOSIS — B009 Herpesviral infection, unspecified: Secondary | ICD-10-CM | POA: Diagnosis not present

## 2018-12-19 DIAGNOSIS — G43909 Migraine, unspecified, not intractable, without status migrainosus: Secondary | ICD-10-CM | POA: Diagnosis not present

## 2018-12-19 DIAGNOSIS — M25569 Pain in unspecified knee: Secondary | ICD-10-CM | POA: Diagnosis not present

## 2018-12-19 DIAGNOSIS — M7660 Achilles tendinitis, unspecified leg: Secondary | ICD-10-CM | POA: Diagnosis not present

## 2019-01-07 DIAGNOSIS — E559 Vitamin D deficiency, unspecified: Secondary | ICD-10-CM | POA: Diagnosis not present

## 2019-01-07 DIAGNOSIS — E785 Hyperlipidemia, unspecified: Secondary | ICD-10-CM | POA: Diagnosis not present

## 2019-01-14 DIAGNOSIS — E559 Vitamin D deficiency, unspecified: Secondary | ICD-10-CM | POA: Diagnosis not present

## 2019-01-14 DIAGNOSIS — M25569 Pain in unspecified knee: Secondary | ICD-10-CM | POA: Diagnosis not present

## 2019-01-14 DIAGNOSIS — M25562 Pain in left knee: Secondary | ICD-10-CM | POA: Diagnosis not present

## 2019-01-14 DIAGNOSIS — G43909 Migraine, unspecified, not intractable, without status migrainosus: Secondary | ICD-10-CM | POA: Diagnosis not present

## 2019-01-14 DIAGNOSIS — E782 Mixed hyperlipidemia: Secondary | ICD-10-CM | POA: Diagnosis not present

## 2019-02-11 DIAGNOSIS — M25562 Pain in left knee: Secondary | ICD-10-CM | POA: Diagnosis not present

## 2019-02-19 DIAGNOSIS — Z1231 Encounter for screening mammogram for malignant neoplasm of breast: Secondary | ICD-10-CM | POA: Diagnosis not present

## 2019-02-19 DIAGNOSIS — Z01419 Encounter for gynecological examination (general) (routine) without abnormal findings: Secondary | ICD-10-CM | POA: Diagnosis not present

## 2019-02-19 DIAGNOSIS — H40023 Open angle with borderline findings, high risk, bilateral: Secondary | ICD-10-CM | POA: Diagnosis not present

## 2019-02-19 DIAGNOSIS — H5213 Myopia, bilateral: Secondary | ICD-10-CM | POA: Diagnosis not present

## 2019-02-19 DIAGNOSIS — Z6832 Body mass index (BMI) 32.0-32.9, adult: Secondary | ICD-10-CM | POA: Diagnosis not present

## 2019-06-01 DIAGNOSIS — Z20828 Contact with and (suspected) exposure to other viral communicable diseases: Secondary | ICD-10-CM | POA: Diagnosis not present

## 2019-06-01 DIAGNOSIS — J329 Chronic sinusitis, unspecified: Secondary | ICD-10-CM | POA: Diagnosis not present

## 2019-06-01 DIAGNOSIS — B9689 Other specified bacterial agents as the cause of diseases classified elsewhere: Secondary | ICD-10-CM | POA: Diagnosis not present

## 2019-06-01 DIAGNOSIS — R0981 Nasal congestion: Secondary | ICD-10-CM | POA: Diagnosis not present

## 2019-06-09 DIAGNOSIS — Z20828 Contact with and (suspected) exposure to other viral communicable diseases: Secondary | ICD-10-CM | POA: Diagnosis not present

## 2019-06-09 DIAGNOSIS — R519 Headache, unspecified: Secondary | ICD-10-CM | POA: Diagnosis not present

## 2019-06-09 DIAGNOSIS — J019 Acute sinusitis, unspecified: Secondary | ICD-10-CM | POA: Diagnosis not present

## 2019-06-09 DIAGNOSIS — R509 Fever, unspecified: Secondary | ICD-10-CM | POA: Diagnosis not present

## 2019-06-19 DIAGNOSIS — J988 Other specified respiratory disorders: Secondary | ICD-10-CM | POA: Diagnosis not present

## 2019-06-19 DIAGNOSIS — U071 COVID-19: Secondary | ICD-10-CM | POA: Diagnosis not present

## 2019-06-29 DIAGNOSIS — J988 Other specified respiratory disorders: Secondary | ICD-10-CM | POA: Diagnosis not present

## 2019-06-29 DIAGNOSIS — Z6830 Body mass index (BMI) 30.0-30.9, adult: Secondary | ICD-10-CM | POA: Diagnosis not present

## 2019-06-29 DIAGNOSIS — F4321 Adjustment disorder with depressed mood: Secondary | ICD-10-CM | POA: Diagnosis not present

## 2019-06-29 DIAGNOSIS — U071 COVID-19: Secondary | ICD-10-CM | POA: Diagnosis not present

## 2019-07-01 DIAGNOSIS — H04123 Dry eye syndrome of bilateral lacrimal glands: Secondary | ICD-10-CM | POA: Diagnosis not present

## 2019-07-02 DIAGNOSIS — J988 Other specified respiratory disorders: Secondary | ICD-10-CM | POA: Diagnosis not present

## 2019-07-02 DIAGNOSIS — Z79899 Other long term (current) drug therapy: Secondary | ICD-10-CM | POA: Diagnosis not present

## 2019-07-02 DIAGNOSIS — Z6830 Body mass index (BMI) 30.0-30.9, adult: Secondary | ICD-10-CM | POA: Diagnosis not present

## 2019-07-02 DIAGNOSIS — F4321 Adjustment disorder with depressed mood: Secondary | ICD-10-CM | POA: Diagnosis not present

## 2019-07-30 DIAGNOSIS — J309 Allergic rhinitis, unspecified: Secondary | ICD-10-CM | POA: Diagnosis not present

## 2019-07-30 DIAGNOSIS — F4321 Adjustment disorder with depressed mood: Secondary | ICD-10-CM | POA: Diagnosis not present

## 2019-07-30 DIAGNOSIS — F5102 Adjustment insomnia: Secondary | ICD-10-CM | POA: Diagnosis not present

## 2019-07-30 DIAGNOSIS — R0982 Postnasal drip: Secondary | ICD-10-CM | POA: Diagnosis not present

## 2019-09-10 DIAGNOSIS — E559 Vitamin D deficiency, unspecified: Secondary | ICD-10-CM | POA: Diagnosis not present

## 2019-09-10 DIAGNOSIS — E782 Mixed hyperlipidemia: Secondary | ICD-10-CM | POA: Diagnosis not present

## 2019-09-16 DIAGNOSIS — Z79899 Other long term (current) drug therapy: Secondary | ICD-10-CM | POA: Diagnosis not present

## 2019-09-16 DIAGNOSIS — J309 Allergic rhinitis, unspecified: Secondary | ICD-10-CM | POA: Diagnosis not present

## 2019-09-16 DIAGNOSIS — E782 Mixed hyperlipidemia: Secondary | ICD-10-CM | POA: Diagnosis not present

## 2019-09-16 DIAGNOSIS — R635 Abnormal weight gain: Secondary | ICD-10-CM | POA: Diagnosis not present

## 2019-09-16 DIAGNOSIS — R0982 Postnasal drip: Secondary | ICD-10-CM | POA: Diagnosis not present

## 2019-09-16 DIAGNOSIS — F5102 Adjustment insomnia: Secondary | ICD-10-CM | POA: Diagnosis not present

## 2019-10-16 DIAGNOSIS — F4321 Adjustment disorder with depressed mood: Secondary | ICD-10-CM | POA: Diagnosis not present

## 2019-11-17 DIAGNOSIS — J309 Allergic rhinitis, unspecified: Secondary | ICD-10-CM | POA: Diagnosis not present

## 2019-11-17 DIAGNOSIS — R0982 Postnasal drip: Secondary | ICD-10-CM | POA: Diagnosis not present

## 2019-11-17 DIAGNOSIS — J45909 Unspecified asthma, uncomplicated: Secondary | ICD-10-CM | POA: Diagnosis not present

## 2019-11-17 DIAGNOSIS — R05 Cough: Secondary | ICD-10-CM | POA: Diagnosis not present

## 2019-12-09 DIAGNOSIS — J309 Allergic rhinitis, unspecified: Secondary | ICD-10-CM | POA: Diagnosis not present

## 2019-12-09 DIAGNOSIS — G43909 Migraine, unspecified, not intractable, without status migrainosus: Secondary | ICD-10-CM | POA: Diagnosis not present

## 2019-12-09 DIAGNOSIS — J45909 Unspecified asthma, uncomplicated: Secondary | ICD-10-CM | POA: Diagnosis not present

## 2019-12-09 DIAGNOSIS — R0982 Postnasal drip: Secondary | ICD-10-CM | POA: Diagnosis not present

## 2019-12-23 DIAGNOSIS — Z6831 Body mass index (BMI) 31.0-31.9, adult: Secondary | ICD-10-CM | POA: Diagnosis not present

## 2019-12-23 DIAGNOSIS — J45909 Unspecified asthma, uncomplicated: Secondary | ICD-10-CM | POA: Diagnosis not present

## 2019-12-23 DIAGNOSIS — J309 Allergic rhinitis, unspecified: Secondary | ICD-10-CM | POA: Diagnosis not present

## 2019-12-23 DIAGNOSIS — R0982 Postnasal drip: Secondary | ICD-10-CM | POA: Diagnosis not present

## 2020-03-17 DIAGNOSIS — Z1231 Encounter for screening mammogram for malignant neoplasm of breast: Secondary | ICD-10-CM | POA: Diagnosis not present

## 2020-03-17 DIAGNOSIS — Z6833 Body mass index (BMI) 33.0-33.9, adult: Secondary | ICD-10-CM | POA: Diagnosis not present

## 2020-03-17 DIAGNOSIS — J45909 Unspecified asthma, uncomplicated: Secondary | ICD-10-CM | POA: Diagnosis not present

## 2020-03-17 DIAGNOSIS — Z23 Encounter for immunization: Secondary | ICD-10-CM | POA: Diagnosis not present

## 2020-03-17 DIAGNOSIS — L280 Lichen simplex chronicus: Secondary | ICD-10-CM | POA: Diagnosis not present

## 2020-03-17 DIAGNOSIS — Z01419 Encounter for gynecological examination (general) (routine) without abnormal findings: Secondary | ICD-10-CM | POA: Diagnosis not present

## 2020-03-17 DIAGNOSIS — E559 Vitamin D deficiency, unspecified: Secondary | ICD-10-CM | POA: Diagnosis not present

## 2020-03-17 DIAGNOSIS — Z1331 Encounter for screening for depression: Secondary | ICD-10-CM | POA: Diagnosis not present

## 2020-03-17 DIAGNOSIS — E782 Mixed hyperlipidemia: Secondary | ICD-10-CM | POA: Diagnosis not present

## 2020-04-06 DIAGNOSIS — Z6831 Body mass index (BMI) 31.0-31.9, adult: Secondary | ICD-10-CM | POA: Diagnosis not present

## 2020-04-06 DIAGNOSIS — J019 Acute sinusitis, unspecified: Secondary | ICD-10-CM | POA: Diagnosis not present

## 2020-04-23 DIAGNOSIS — J00 Acute nasopharyngitis [common cold]: Secondary | ICD-10-CM | POA: Diagnosis not present

## 2020-05-25 DIAGNOSIS — J0110 Acute frontal sinusitis, unspecified: Secondary | ICD-10-CM | POA: Diagnosis not present

## 2020-05-25 DIAGNOSIS — R0981 Nasal congestion: Secondary | ICD-10-CM | POA: Diagnosis not present

## 2020-09-13 DIAGNOSIS — F5102 Adjustment insomnia: Secondary | ICD-10-CM | POA: Diagnosis not present

## 2020-09-13 DIAGNOSIS — J309 Allergic rhinitis, unspecified: Secondary | ICD-10-CM | POA: Diagnosis not present

## 2020-09-13 DIAGNOSIS — R0982 Postnasal drip: Secondary | ICD-10-CM | POA: Diagnosis not present

## 2020-09-13 DIAGNOSIS — Z13220 Encounter for screening for lipoid disorders: Secondary | ICD-10-CM | POA: Diagnosis not present

## 2020-09-13 DIAGNOSIS — Z0000 Encounter for general adult medical examination without abnormal findings: Secondary | ICD-10-CM | POA: Diagnosis not present

## 2020-09-13 DIAGNOSIS — Z131 Encounter for screening for diabetes mellitus: Secondary | ICD-10-CM | POA: Diagnosis not present

## 2020-12-02 DIAGNOSIS — K5792 Diverticulitis of intestine, part unspecified, without perforation or abscess without bleeding: Secondary | ICD-10-CM | POA: Diagnosis not present

## 2020-12-02 DIAGNOSIS — Z6831 Body mass index (BMI) 31.0-31.9, adult: Secondary | ICD-10-CM | POA: Diagnosis not present

## 2020-12-05 DIAGNOSIS — Z7952 Long term (current) use of systemic steroids: Secondary | ICD-10-CM | POA: Diagnosis not present

## 2020-12-05 DIAGNOSIS — R1011 Right upper quadrant pain: Secondary | ICD-10-CM | POA: Diagnosis not present

## 2020-12-05 DIAGNOSIS — R1031 Right lower quadrant pain: Secondary | ICD-10-CM | POA: Diagnosis not present

## 2020-12-05 DIAGNOSIS — Z6831 Body mass index (BMI) 31.0-31.9, adult: Secondary | ICD-10-CM | POA: Diagnosis not present

## 2020-12-05 DIAGNOSIS — K5792 Diverticulitis of intestine, part unspecified, without perforation or abscess without bleeding: Secondary | ICD-10-CM | POA: Diagnosis not present

## 2020-12-05 DIAGNOSIS — K219 Gastro-esophageal reflux disease without esophagitis: Secondary | ICD-10-CM | POA: Diagnosis not present

## 2020-12-05 DIAGNOSIS — Z79899 Other long term (current) drug therapy: Secondary | ICD-10-CM | POA: Diagnosis not present

## 2021-01-04 ENCOUNTER — Other Ambulatory Visit: Payer: Self-pay

## 2021-01-04 ENCOUNTER — Ambulatory Visit (INDEPENDENT_AMBULATORY_CARE_PROVIDER_SITE_OTHER): Payer: Federal, State, Local not specified - PPO | Admitting: Gastroenterology

## 2021-01-04 VITALS — BP 122/78 | HR 65 | Ht 63.0 in | Wt 186.2 lb

## 2021-01-04 DIAGNOSIS — K219 Gastro-esophageal reflux disease without esophagitis: Secondary | ICD-10-CM

## 2021-01-04 DIAGNOSIS — K222 Esophageal obstruction: Secondary | ICD-10-CM

## 2021-01-04 DIAGNOSIS — Z1211 Encounter for screening for malignant neoplasm of colon: Secondary | ICD-10-CM

## 2021-01-04 MED ORDER — PANTOPRAZOLE SODIUM 20 MG PO TBEC: 20.0000 mg | DELAYED_RELEASE_TABLET | Freq: Every day | ORAL | 4 refills | Status: DC

## 2021-01-04 NOTE — Patient Instructions (Signed)
You have been scheduled for an endoscopy and colonoscopy. Please follow the written instructions given to you at your visit today. Please pick up your prep supplies at the pharmacy within the next 1-3 days. If you use inhalers (even only as needed), please bring them with you on the day of your procedure.(Sample of Clenpiq given to you today)  We have sent the following medications to your pharmacy for you to pick up at your convenience: Protonix  If you are age 57 or younger, your body mass index should be between 19-25. Your Body mass index is 32.99 kg/m. If this is out of the aformentioned range listed, please consider follow up with your Primary Care Provider.   __________________________________________________________  The [facility] providers would like to encourage you to use [facility] to communicate with providers for non-urgent requests or questions.  Due to long hold times on the telephone, sending your provider a message by [facility] may be a faster and more efficient way to get a response.  Please allow 48 business hours for a response.  Please remember that this is for non-urgent requests.   Thank you,  Dr. Jackquline Denmark

## 2021-01-04 NOTE — Progress Notes (Signed)
Chief Complaint: GI evaluation.  Referring Provider:  Melony Overly, MD      ASSESSMENT AND PLAN;   #1. GERD with H/O eso stricture, now with recurrent dysphagia.  #2. Colorectal cancer screening  #3. R sided pain (likely musculoskeletal-resolved), neg CT AP 2022 (reviewed with pt)  Plan: -EGD with dil/colon -Protonix '20mg'$  po qd #90, 4 refills.   I discussed EGD/Colonoscopy- the indications, risks, alternatives and potential complications including, but not limited to, bleeding, infection, reaction to medication, damage to internal organs, cardiac and/or pulmonary problems, and perforation requiring surgery (1 to 2 in 1000). The possibility that significant findings could be missed was explained. All ? were answered. The patient gives consent to proceed. HPI:    Brittany Thomas is a 55 y.o. female   C/O dysphagia x 1 yr, getting worse, mostly solids, with water as well, mid chest with associated heartburn.  She has stopped taking Nexium on her own.  As it was not working.  She denies having any odynophagia.  No melena or weight loss.  Had an episode of right-sided flank and lower pain after helping her son to move to [employer].  He was initially thought to be diverticulitis.  She was given a trial of Cipro/Flagyl.  CT scan Abdo/pelvis with p.o. and IV contrast was negative.  It was thought that she has musculoskeletal pain.  She was treated symptomatically and has gotten better.  She is due for screening colonoscopy.  Denies having any diarrhea or constipation.  Has 3 Bms/day with probiotics.  No fever chills or night sweats.  No jaundice dark urine or pale stools.  No sodas, chocolates, chewing gums, artificial sweeteners and candy. No NSAIDs  She is taking [facility] dental work-up.   Previous GI work-up: Barium swallow 05/2011: Hold-up of the barium tablet in the distal esophagus with circumferential narrowing consistent with esophageal  stricture.  EGD 05/2011: -Distal esophageal stricture s/p eso balloon dilatation (50Fr) -Incidental gastric polyps.  Fundic gland polyps -Neg esophageal biopsies for EoE. + Bx for reflux.  Colonoscopy 01/2009: ([facility]) Mild pancolonic diverticulosis.  Neg random colon bx for microscopic colitis.  CT AP with contrast August 2022 -No acute intra-abdominal or intrapelvic process. Normal appendix.     SH-works for [place] [employer], daughter works for [employer], son (21)for Conservator, museum/gallery in [place]   Past Medical History:  Diagnosis Date   Allergic rhinitis    Arthralgia of multiple sites    Bronchitis    Calcium nephrolithiasis    Dermatitis herpetiformis    Diverticulitis    Diverticulitis    Diverticulosis    GERD (gastroesophageal reflux disease)    Hyperlipidemia    IBS (irritable bowel syndrome)    Migraine    Recurrent herpes simplex    Rhinorrhea    Superficial varicosities    Right   Vitamin D deficiency     Past Surgical History:  Procedure Laterality Date   BREAST BIOPSY     CESAREAN SECTION     x2   COLONOSCOPY  02/03/2009   Mild pancolonic diverticulosis. Otherwise normal colonoscopy   ESOPHAGOGASTRODUODENOSCOPY  05/23/2011   Distal esophageal stricture, status post esophageal dilatation. Incidental gastic polyps (status post polypectomy previously deemed to be fundic gland polyps). Mild gastroduodenitis.   TUBAL LIGATION Bilateral     No family history on file.  Social History   Tobacco Use   Smoking status: Never   Smokeless tobacco: Never  Substance Use Topics   Alcohol  use: No    Current Outpatient Medications  Medication Sig Dispense Refill   acetaminophen (TYLENOL) 500 MG tablet Take 500 mg by mouth every 6 (six) hours as needed.     cetirizine (ZYRTEC) 10 MG tablet Take 10 mg by mouth daily.     cholecalciferol (VITAMIN D) 400 UNITS TABS Take 400 Units by mouth daily.     clindamycin (CLEOCIN) 300 MG capsule Take 300 mg by mouth every 8 (eight)  hours.     cyclobenzaprine (FLEXERIL) 5 MG tablet Take 5 mg by mouth daily as needed. For pain     ibuprofen (ADVIL,MOTRIN) 600 MG tablet ibuprofen 600 mg tablet     lidocaine (LIDODERM) 5 % 1 patch daily as needed.     loratadine (CLARITIN) 10 MG tablet Take 10 mg by mouth daily.     metroNIDAZOLE (FLAGYL) 500 MG tablet metronidazole 500 mg tablet     Probiotic Product (PROBIOTIC DAILY PO) Take 1 capsule by mouth daily.     propranolol (INDERAL) 60 MG tablet Take 60 mg by mouth 3 (three) times daily.     valACYclovir (VALTREX) 1000 MG tablet valacyclovir 1 gram tablet     Current Facility-Administered Medications  Medication Dose Route Frequency Provider Last Rate Last Admin   triamcinolone acetonide (KENALOG) 10 MG/ML injection 10 mg  10 mg Other Once Landis Martins, DPM        Allergies  Allergen Reactions   Penicillins     rash    Review of Systems:  Constitutional: Denies fever, chills, diaphoresis, appetite change and fatigue.  HEENT: Denies photophobia, eye pain, redness, hearing loss, ear pain, congestion, sore throat, rhinorrhea, sneezing, mouth sores, neck pain, neck stiffness and tinnitus.   Respiratory: Denies SOB, DOE, cough, chest tightness,  and wheezing.   Cardiovascular: Denies chest pain, palpitations and leg swelling.  Genitourinary: Denies dysuria, urgency, frequency, hematuria, flank pain and difficulty urinating.  Musculoskeletal: Denies myalgias, back pain, joint swelling, arthralgias and gait problem.  Skin: No rash.  Neurological: Denies dizziness, seizures, syncope, weakness, light-headedness, numbness and headaches.  Hematological: Denies adenopathy. Easy bruising, personal or family bleeding history  Psychiatric/Behavioral: No anxiety or depression     Physical Exam:    BP 122/78   Pulse 65   Ht '5\' 3"'$  (1.6 m)   Wt 186 lb 4 oz (84.5 kg)   SpO2 98%   BMI 32.99 kg/m  Wt Readings from Last 3 Encounters:  01/04/21 186 lb 4 oz (84.5 kg)    Constitutional:  Well-developed, in no acute distress. Psychiatric: Normal mood and affect. Behavior is normal. HEENT: Pupils normal.  Conjunctivae are normal. No scleral icterus. Cardiovascular: Normal rate, regular rhythm. No edema Pulmonary/chest: Effort normal and breath sounds normal. No wheezing, rales or rhonchi. Abdominal: Soft, nondistended. Nontender. Bowel sounds active throughout. There are no masses palpable. No hepatomegaly. Rectal: Deferred Neurological: Alert and oriented to person place and time. Skin: Skin is warm and dry. No rashes noted.    Carmell Austria, MD 01/04/2021, 11:10 AM  Cc: Melony Overly, MD

## 2021-01-11 DIAGNOSIS — K080 Exfoliation of teeth due to systemic causes: Secondary | ICD-10-CM | POA: Diagnosis not present

## 2021-03-16 ENCOUNTER — Other Ambulatory Visit: Payer: Self-pay

## 2021-03-16 ENCOUNTER — Encounter: Payer: Self-pay | Admitting: Gastroenterology

## 2021-03-16 ENCOUNTER — Ambulatory Visit (AMBULATORY_SURGERY_CENTER): Payer: Federal, State, Local not specified - PPO | Admitting: Gastroenterology

## 2021-03-16 VITALS — BP 121/67 | HR 67 | Temp 97.8°F | Resp 14 | Ht 63.0 in | Wt 186.0 lb

## 2021-03-16 DIAGNOSIS — K222 Esophageal obstruction: Secondary | ICD-10-CM | POA: Diagnosis not present

## 2021-03-16 DIAGNOSIS — Z1211 Encounter for screening for malignant neoplasm of colon: Secondary | ICD-10-CM | POA: Diagnosis not present

## 2021-03-16 DIAGNOSIS — K317 Polyp of stomach and duodenum: Secondary | ICD-10-CM | POA: Diagnosis not present

## 2021-03-16 DIAGNOSIS — D122 Benign neoplasm of ascending colon: Secondary | ICD-10-CM

## 2021-03-16 DIAGNOSIS — K219 Gastro-esophageal reflux disease without esophagitis: Secondary | ICD-10-CM

## 2021-03-16 DIAGNOSIS — D120 Benign neoplasm of cecum: Secondary | ICD-10-CM

## 2021-03-16 DIAGNOSIS — K635 Polyp of colon: Secondary | ICD-10-CM | POA: Diagnosis not present

## 2021-03-16 DIAGNOSIS — R1310 Dysphagia, unspecified: Secondary | ICD-10-CM | POA: Diagnosis not present

## 2021-03-16 MED ORDER — SODIUM CHLORIDE 0.9 % IV SOLN
500.0000 mL | Freq: Once | INTRAVENOUS | Status: DC
Start: 2021-03-16 — End: 2021-03-16

## 2021-03-16 NOTE — Op Note (Signed)
[facility] Patient Name: Brittany Thomas Procedure Date: 03/16/2021 10:01 AM MRN: [id number] Endoscopist: Jackquline Denmark , MD Age: 55 Referring MD:  Date of Birth: May 03, 1966 Gender: Female Account #: [id number] Procedure:                Colonoscopy Indications:              Screening for colorectal malignant neoplasm Medicines:                Monitored Anesthesia Care Procedure:                Pre-Anesthesia Assessment:                           - Prior to the procedure, a History and Physical                            was performed, and patient medications and                            allergies were reviewed. The patient's tolerance of                            previous anesthesia was also reviewed. The risks                            and benefits of the procedure and the sedation                            options and risks were discussed with the patient.                            All questions were answered, and informed consent                            was obtained. Prior Anticoagulants: The patient has                            taken no previous anticoagulant or antiplatelet                            agents. ASA Grade Assessment: II - A patient with                            mild systemic disease. After reviewing the risks                            and benefits, the patient was deemed in                            satisfactory condition to undergo the procedure.                           After obtaining informed consent, the colonoscope  was passed under direct vision. Throughout the                            procedure, the patient's blood pressure, pulse, and                            oxygen saturations were monitored continuously. The                            PCF-HQ190L Colonoscope was introduced through the                            anus and advanced to the the cecum, identified by                            appendiceal  orifice and ileocecal valve. The                            colonoscopy was performed without difficulty. The                            patient tolerated the procedure well. The quality                            of the bowel preparation was good. The ileocecal                            valve, appendiceal orifice, and rectum were                            photographed. Scope In: 10:27:24 AM Scope Out: 10:40:46 AM Scope Withdrawal Time: 0 hours 10 minutes 42 seconds  Total Procedure Duration: 0 hours 13 minutes 22 seconds  Findings:                 Two sessile polyps were found in the ascending                            colon and cecum. The polyps were 2 to 3 mm in size.                            These polyps were removed with a cold biopsy                            forceps. Resection and retrieval were complete.                           A few medium-mouthed diverticula were found in the                            sigmoid colon, ascending colon and cecum.                           Non-bleeding internal hemorrhoids were found during  retroflexion. The hemorrhoids were small and Grade                            I (internal hemorrhoids that do not prolapse).                           The exam was otherwise without abnormality on                            direct and retroflexion views. Complications:            No immediate complications. Estimated Blood Loss:     Estimated blood loss: none. Impression:               - Two 2 to 3 mm polyps in the ascending colon and                            in the cecum, removed with a cold biopsy forceps.                            Resected and retrieved.                           - Mild pancolonic diverticulosis.                           - Non-bleeding internal hemorrhoids.                           - The examination was otherwise normal on direct                            and retroflexion views. Recommendation:            - Patient has a contact number available for                            emergencies. The signs and symptoms of potential                            delayed complications were discussed with the                            patient. Return to normal activities tomorrow.                            Written discharge instructions were provided to the                            patient.                           - High fiber diet.                           - Continue present medications.                           -  Repeat colonoscopy depending upon the Bx results.                           - The findings and recommendations were discussed                            with the patient's family. Jackquline Denmark, MD 03/16/2021 10:59:23 AM This report has been signed electronically.

## 2021-03-16 NOTE — Progress Notes (Signed)
Called to room to assist during endoscopic procedure.  Patient ID and intended procedure confirmed with present staff. Received instructions for my participation in the procedure from the performing physician.  

## 2021-03-16 NOTE — Patient Instructions (Addendum)
Handouts were given to your care partner on esophageal dilatation diet the rest of today, polyps, diverticulosis, hemorrhoids, and a high fiber diet with liberal fluid intake. Continue taking PROTONIX 20 mg daily. You may resume your current medications today. Await biopsy results.  May take 1-3 weeks to receive pathology results. Please call if any questions or concerns.       YOU HAD AN ENDOSCOPIC PROCEDURE TODAY AT [facility]:   Refer to the procedure report that was given to you for any specific questions about what was found during the examination.  If the procedure report does not answer your questions, please call your gastroenterologist to clarify.  If you requested that your care partner not be given the details of your procedure findings, then the procedure report has been included in a sealed envelope for you to review at your convenience later.  YOU SHOULD EXPECT: Some feelings of bloating in the abdomen. Passage of more gas than usual.  Walking can help get rid of the air that was put into your GI tract during the procedure and reduce the bloating. If you had a lower endoscopy (such as a colonoscopy or flexible sigmoidoscopy) you may notice spotting of blood in your stool or on the toilet paper. If you underwent a bowel prep for your procedure, you may not have a normal bowel movement for a few days.  Please Note:  You might notice some irritation and congestion in your nose or some drainage.  This is from the oxygen used during your procedure.  There is no need for concern and it should clear up in a day or so.  SYMPTOMS TO REPORT IMMEDIATELY:  Following lower endoscopy (colonoscopy or flexible sigmoidoscopy):  Excessive amounts of blood in the stool  Significant tenderness or worsening of abdominal pains  Swelling of the abdomen that is new, acute  Fever of 100F or higher  Following upper endoscopy (EGD)  Vomiting of blood or coffee ground material  New  chest pain or pain under the shoulder blades  Painful or persistently difficult swallowing  New shortness of breath  Fever of 100F or higher  Black, tarry-looking stools  For urgent or emergent issues, a gastroenterologist can be reached at any hour by calling [phone]. Do not use MyChart messaging for urgent concerns.    DIET:  Please follow the esophageal dilatation diet the rest of today.  Handout was attached to you after visit summary.    Drink plenty of fluids but you should avoid alcoholic beverages for 24 hours.  ACTIVITY:  You should plan to take it easy for the rest of today and you should NOT DRIVE or use heavy machinery until tomorrow (because of the sedation medicines used during the test).    FOLLOW UP: Our staff will call the number listed on your records 48-72 hours following your procedure to check on you and address any questions or concerns that you may have regarding the information given to you following your procedure. If we do not reach you, we will leave a message.  We will attempt to reach you two times.  During this call, we will ask if you have developed any symptoms of COVID 19. If you develop any symptoms (ie: fever, flu-like symptoms, shortness of breath, cough etc.) before then, please call [phone].  If you test positive for Covid 19 in the 2 weeks post procedure, please call and report this information to [place].    If any biopsies were  taken you will be contacted by phone or by letter within the next 1-3 weeks.  Please call us at [phone] if you have not heard about the biopsies in 3 weeks.    SIGNATURES/CONFIDENTIALITY: You and/or your care partner have signed paperwork which will be entered into your electronic medical record.  These signatures attest to the fact that that the information above on your After Visit Summary has been reviewed and is understood.  Full responsibility of the confidentiality of this discharge information lies with you  and/or your care-partner.

## 2021-03-16 NOTE — Progress Notes (Signed)
Chief Complaint: GI evaluation.  Referring Provider:  Melony Overly, MD      ASSESSMENT AND PLAN;   #1. GERD with H/O eso stricture, now with recurrent dysphagia.  #2. Colorectal cancer screening  #3. R sided pain (likely musculoskeletal-resolved), neg CT AP 2022 (reviewed with pt)  Plan: -EGD with dil/colon -Protonix 20mg  po qd #90, 4 refills.   I discussed EGD/Colonoscopy- the indications, risks, alternatives and potential complications including, but not limited to, bleeding, infection, reaction to medication, damage to internal organs, cardiac and/or pulmonary problems, and perforation requiring surgery (1 to 2 in 1000). The possibility that significant findings could be missed was explained. All ? were answered. The patient gives consent to proceed. HPI:    Brittany Thomas is a 55 y.o. female   C/O dysphagia x 1 yr, getting worse, mostly solids, with water as well, mid chest with associated heartburn.  She has stopped taking Nexium on her own.  As it was not working.  She denies having any odynophagia.  No melena or weight loss.  Had an episode of right-sided flank and lower pain after helping her son to move to [employer].  He was initially thought to be diverticulitis.  She was given a trial of Cipro/Flagyl.  CT scan Abdo/pelvis with p.o. and IV contrast was negative.  It was thought that she has musculoskeletal pain.  She was treated symptomatically and has gotten better.  She is due for screening colonoscopy.  Denies having any diarrhea or constipation.  Has 3 Bms/day with probiotics.  No fever chills or night sweats.  No jaundice dark urine or pale stools.  No sodas, chocolates, chewing gums, artificial sweeteners and candy. No NSAIDs  She is taking [facility] dental work-up.   Previous GI work-up: Barium swallow 05/2011: Hold-up of the barium tablet in the distal esophagus with circumferential narrowing consistent with esophageal  stricture.  EGD 05/2011: -Distal esophageal stricture s/p eso balloon dilatation (50Fr) -Incidental gastric polyps.  Fundic gland polyps -Neg esophageal biopsies for EoE. + Bx for reflux.  Colonoscopy 01/2009: ([facility]) Mild pancolonic diverticulosis.  Neg random colon bx for microscopic colitis.  CT AP with contrast August 2022 -No acute intra-abdominal or intrapelvic process. Normal appendix.     SH-works for [place] [employer], daughter works for [employer], son (21)for Conservator, museum/gallery in [place]   Past Medical History:  Diagnosis Date   Allergic rhinitis    Allergy    Arthralgia of multiple sites    Asthma    Bronchitis    Calcium nephrolithiasis    Dermatitis herpetiformis    Diverticulitis    Diverticulitis    Diverticulosis    GERD (gastroesophageal reflux disease)    Hyperlipidemia    IBS (irritable bowel syndrome)    Migraine    Recurrent herpes simplex    Rhinorrhea    Superficial varicosities    Right   Vitamin D deficiency     Past Surgical History:  Procedure Laterality Date   BREAST BIOPSY     CESAREAN SECTION     x2   COLONOSCOPY  02/03/2009   Mild pancolonic diverticulosis. Otherwise normal colonoscopy   ESOPHAGOGASTRODUODENOSCOPY  05/23/2011   Distal esophageal stricture, status post esophageal dilatation. Incidental gastic polyps (status post polypectomy previously deemed to be fundic gland polyps). Mild gastroduodenitis.   TUBAL LIGATION Bilateral     History reviewed. No pertinent family history.  Social History   Tobacco Use   Smoking status: Never   Smokeless  tobacco: Never  Substance Use Topics   Alcohol use: No    Current Outpatient Medications  Medication Sig Dispense Refill   cetirizine (ZYRTEC) 10 MG tablet Take 10 mg by mouth daily.     cholecalciferol (VITAMIN D) 400 UNITS TABS Take 400 Units by mouth daily.     ibuprofen (ADVIL,MOTRIN) 600 MG tablet ibuprofen 600 mg tablet     pantoprazole (PROTONIX) 20 MG tablet Take 1 tablet (20 mg  total) by mouth daily. 90 tablet 4   Probiotic Product (PROBIOTIC DAILY PO) Take 1 capsule by mouth daily.     propranolol (INDERAL) 60 MG tablet Take 60 mg by mouth 3 (three) times daily.     acetaminophen (TYLENOL) 500 MG tablet Take 500 mg by mouth every 6 (six) hours as needed. (Patient not taking: Reported on 03/16/2021)     albuterol (VENTOLIN HFA) 108 (90 Base) MCG/ACT inhaler albuterol sulfate HFA 90 mcg/actuation aerosol inhaler     azelastine (ASTELIN) 0.1 % nasal spray azelastine 137 mcg (0.1 %) nasal spray aerosol (Patient not taking: Reported on 03/16/2021)     clindamycin (CLEOCIN) 300 MG capsule Take 300 mg by mouth every 8 (eight) hours. (Patient not taking: Reported on 03/16/2021)     cyclobenzaprine (FLEXERIL) 5 MG tablet Take 5 mg by mouth daily as needed. For pain (Patient not taking: Reported on 03/16/2021)     lidocaine (LIDODERM) 5 % 1 patch daily as needed. (Patient not taking: Reported on 03/16/2021)     loratadine (CLARITIN) 10 MG tablet Take 10 mg by mouth daily. (Patient not taking: Reported on 03/16/2021)     metroNIDAZOLE (FLAGYL) 500 MG tablet metronidazole 500 mg tablet (Patient not taking: Reported on 03/16/2021)     valACYclovir (VALTREX) 1000 MG tablet valacyclovir 1 gram tablet (Patient not taking: Reported on 03/16/2021)     Current Facility-Administered Medications  Medication Dose Route Frequency Provider Last Rate Last Admin   0.9 %  sodium chloride infusion  500 mL Intravenous Once Jackquline Denmark, MD       triamcinolone acetonide (KENALOG) 10 MG/ML injection 10 mg  10 mg Other Once Landis Martins, DPM        Allergies  Allergen Reactions   Penicillins     rash    Review of Systems:  Constitutional: Denies fever, chills, diaphoresis, appetite change and fatigue.  HEENT: Denies photophobia, eye pain, redness, hearing loss, ear pain, congestion, sore throat, rhinorrhea, sneezing, mouth sores, neck pain, neck stiffness and tinnitus.   Respiratory:  Denies SOB, DOE, cough, chest tightness,  and wheezing.   Cardiovascular: Denies chest pain, palpitations and leg swelling.  Genitourinary: Denies dysuria, urgency, frequency, hematuria, flank pain and difficulty urinating.  Musculoskeletal: Denies myalgias, back pain, joint swelling, arthralgias and gait problem.  Skin: No rash.  Neurological: Denies dizziness, seizures, syncope, weakness, light-headedness, numbness and headaches.  Hematological: Denies adenopathy. Easy bruising, personal or family bleeding history  Psychiatric/Behavioral: No anxiety or depression     Physical Exam:    BP 122/72   Pulse 62   Temp 97.8 F (36.6 C)   Ht 5\' 3"  (1.6 m)   Wt 186 lb (84.4 kg)   SpO2 100%   BMI 32.95 kg/m  Wt Readings from Last 3 Encounters:  03/16/21 186 lb (84.4 kg)  01/04/21 186 lb 4 oz (84.5 kg)   Constitutional:  Well-developed, in no acute distress. Psychiatric: Normal mood and affect. Behavior is normal. HEENT: Pupils normal.  Conjunctivae are normal. No scleral icterus. Cardiovascular:  Normal rate, regular rhythm. No edema Pulmonary/chest: Effort normal and breath sounds normal. No wheezing, rales or rhonchi. Abdominal: Soft, nondistended. Nontender. Bowel sounds active throughout. There are no masses palpable. No hepatomegaly. Rectal: Deferred Neurological: Alert and oriented to person place and time. Skin: Skin is warm and dry. No rashes noted.    Carmell Austria, MD 03/16/2021, 9:59 AM  Cc: Melony Overly, MD

## 2021-03-16 NOTE — Progress Notes (Signed)
No problems noted in the recovery room. maw 

## 2021-03-16 NOTE — Op Note (Signed)
[place] Patient Name: Brittany Thomas Procedure Date: 03/16/2021 10:02 AM MRN: [id number] Endoscopist: Jackquline Denmark , MD Age: 55 Referring MD:  Date of Birth: Aug 30, 1965 Gender: Female Account #: [id number] Procedure:                Upper GI endoscopy Indications:              Dysphagia. GERD Medicines:                Monitored Anesthesia Care Procedure:                Pre-Anesthesia Assessment:                           - Prior to the procedure, a History and Physical                            was performed, and patient medications and                            allergies were reviewed. The patient's tolerance of                            previous anesthesia was also reviewed. The risks                            and benefits of the procedure and the sedation                            options and risks were discussed with the patient.                            All questions were answered, and informed consent                            was obtained. Prior Anticoagulants: The patient has                            taken no previous anticoagulant or antiplatelet                            agents. ASA Grade Assessment: II - A patient with                            mild systemic disease. After reviewing the risks                            and benefits, the patient was deemed in                            satisfactory condition to undergo the procedure.                           After obtaining informed consent, the endoscope was  passed under direct vision. Throughout the                            procedure, the patient's blood pressure, pulse, and                            oxygen saturations were monitored continuously. The                            Endoscope was introduced through the mouth, and                            advanced to the second part of duodenum. The upper                            GI endoscopy was accomplished without  difficulty.                            The patient tolerated the procedure well. Scope In: Scope Out: Findings:                 One benign-appearing, intrinsic mild stenosis was                            found 38 cm from the incisors. This stenosis                            measured 1.3 cm (inner diameter). The stenosis was                            traversed. A TTS dilator was passed through the                            scope. Dilation with a 16-17-18 mm balloon dilator                            was performed to 17 mm. The dilation site was                            examined and showed mild mucosal disruption and                            complete resolution of luminal narrowing.                           A few 4 to 8 mm semi-sessile polyps with no                            bleeding and no stigmata of recent bleeding were                            found in the gastric fundus and in the gastric  body. Three polyps were removed with a hot snare.                            Resection and retrieval were complete.                           The exam of the stomach was otherwise normal.                           The examined duodenum was normal. Complications:            No immediate complications. Estimated Blood Loss:     Estimated blood loss: none. Impression:               - Benign-appearing esophageal stenosis. Dilated.                           - A few gastric polyps. Resected and retrievedm x 3                           - Normal examined duodenum. Recommendation:           - Patient has a contact number available for                            emergencies. The signs and symptoms of potential                            delayed complications were discussed with the                            patient. Return to normal activities tomorrow.                            Written discharge instructions were provided to the                            patient.                            - Post dilatation diet.                           - Continue present medications Including Protonix                            20 mg p.o. once a day.                           - Await pathology results.                           - If still with problems, will consider eso                            manometry.                           -  The findings and recommendations were discussed                            with the patient's family. Jackquline Denmark, MD 03/16/2021 10:53:40 AM This report has been signed electronically.

## 2021-03-16 NOTE — Progress Notes (Signed)
Report to PACU, RN, vss, BBS= Clear.  

## 2021-03-20 ENCOUNTER — Telehealth: Payer: Self-pay

## 2021-03-20 NOTE — Telephone Encounter (Signed)
No answer, left message to call back later today, B.Corbyn Steedman RN. 

## 2021-03-20 NOTE — Telephone Encounter (Signed)
Second attempt follow up call to pt, LM on VM ?

## 2021-03-21 ENCOUNTER — Encounter: Payer: Self-pay | Admitting: Gastroenterology

## 2021-03-28 DIAGNOSIS — J329 Chronic sinusitis, unspecified: Secondary | ICD-10-CM | POA: Diagnosis not present

## 2021-03-28 DIAGNOSIS — Z6831 Body mass index (BMI) 31.0-31.9, adult: Secondary | ICD-10-CM | POA: Diagnosis not present

## 2021-03-28 DIAGNOSIS — J40 Bronchitis, not specified as acute or chronic: Secondary | ICD-10-CM | POA: Diagnosis not present

## 2021-04-01 ENCOUNTER — Other Ambulatory Visit: Payer: Self-pay | Admitting: Gastroenterology

## 2021-04-03 ENCOUNTER — Telehealth: Payer: Self-pay

## 2021-04-03 NOTE — Telephone Encounter (Signed)
[employer] has been approved PA for Pantoprazole 20 MG from 03/04/21 through 04/03/2022.

## 2021-04-06 DIAGNOSIS — Z1231 Encounter for screening mammogram for malignant neoplasm of breast: Secondary | ICD-10-CM | POA: Diagnosis not present

## 2021-04-06 DIAGNOSIS — Z01419 Encounter for gynecological examination (general) (routine) without abnormal findings: Secondary | ICD-10-CM | POA: Diagnosis not present

## 2021-04-06 DIAGNOSIS — Z6832 Body mass index (BMI) 32.0-32.9, adult: Secondary | ICD-10-CM | POA: Diagnosis not present

## 2021-04-13 DIAGNOSIS — R0982 Postnasal drip: Secondary | ICD-10-CM | POA: Diagnosis not present

## 2021-04-13 DIAGNOSIS — M1712 Unilateral primary osteoarthritis, left knee: Secondary | ICD-10-CM | POA: Diagnosis not present

## 2021-04-13 DIAGNOSIS — K219 Gastro-esophageal reflux disease without esophagitis: Secondary | ICD-10-CM | POA: Diagnosis not present

## 2021-04-13 DIAGNOSIS — J309 Allergic rhinitis, unspecified: Secondary | ICD-10-CM | POA: Diagnosis not present

## 2021-06-16 DIAGNOSIS — Z6830 Body mass index (BMI) 30.0-30.9, adult: Secondary | ICD-10-CM | POA: Diagnosis not present

## 2021-06-16 DIAGNOSIS — M533 Sacrococcygeal disorders, not elsewhere classified: Secondary | ICD-10-CM | POA: Diagnosis not present

## 2021-06-16 DIAGNOSIS — M545 Low back pain, unspecified: Secondary | ICD-10-CM | POA: Diagnosis not present

## 2021-06-26 ENCOUNTER — Encounter (HOSPITAL_BASED_OUTPATIENT_CLINIC_OR_DEPARTMENT_OTHER): Payer: Self-pay

## 2021-06-26 ENCOUNTER — Other Ambulatory Visit (HOSPITAL_BASED_OUTPATIENT_CLINIC_OR_DEPARTMENT_OTHER): Payer: Self-pay | Admitting: Family Medicine

## 2021-06-26 ENCOUNTER — Ambulatory Visit (HOSPITAL_BASED_OUTPATIENT_CLINIC_OR_DEPARTMENT_OTHER)
Admission: RE | Admit: 2021-06-26 | Discharge: 2021-06-26 | Disposition: A | Discharge status: Home / Self Care | Payer: Federal, State, Local not specified - PPO | Source: Ambulatory Visit | Attending: Family Medicine | Admitting: Family Medicine

## 2021-06-26 ENCOUNTER — Other Ambulatory Visit: Payer: Self-pay

## 2021-06-26 DIAGNOSIS — Z6830 Body mass index (BMI) 30.0-30.9, adult: Secondary | ICD-10-CM | POA: Diagnosis not present

## 2021-06-26 DIAGNOSIS — K6289 Other specified diseases of anus and rectum: Secondary | ICD-10-CM | POA: Insufficient documentation

## 2021-06-26 DIAGNOSIS — R109 Unspecified abdominal pain: Secondary | ICD-10-CM | POA: Diagnosis not present

## 2021-06-26 MED ORDER — IOHEXOL 300 MG/ML  SOLN
85.0000 mL | Freq: Once | INTRAMUSCULAR | Status: AC | PRN
  Administered 2021-06-26: 85 mL via INTRAVENOUS

## 2021-07-17 DIAGNOSIS — H40023 Open angle with borderline findings, high risk, bilateral: Secondary | ICD-10-CM | POA: Diagnosis not present

## 2021-09-14 DIAGNOSIS — Z0000 Encounter for general adult medical examination without abnormal findings: Secondary | ICD-10-CM | POA: Diagnosis not present

## 2021-09-14 DIAGNOSIS — Z1331 Encounter for screening for depression: Secondary | ICD-10-CM | POA: Diagnosis not present

## 2021-09-14 DIAGNOSIS — Z6830 Body mass index (BMI) 30.0-30.9, adult: Secondary | ICD-10-CM | POA: Diagnosis not present

## 2021-09-14 DIAGNOSIS — E782 Mixed hyperlipidemia: Secondary | ICD-10-CM | POA: Diagnosis not present

## 2021-09-14 DIAGNOSIS — K219 Gastro-esophageal reflux disease without esophagitis: Secondary | ICD-10-CM | POA: Diagnosis not present

## 2021-10-10 ENCOUNTER — Encounter: Payer: Self-pay | Admitting: Family Medicine

## 2022-05-03 DIAGNOSIS — J329 Chronic sinusitis, unspecified: Secondary | ICD-10-CM | POA: Diagnosis not present

## 2022-05-03 DIAGNOSIS — J40 Bronchitis, not specified as acute or chronic: Secondary | ICD-10-CM | POA: Diagnosis not present

## 2022-05-18 DIAGNOSIS — Z1231 Encounter for screening mammogram for malignant neoplasm of breast: Secondary | ICD-10-CM | POA: Diagnosis not present

## 2022-05-18 DIAGNOSIS — Z01419 Encounter for gynecological examination (general) (routine) without abnormal findings: Secondary | ICD-10-CM | POA: Diagnosis not present

## 2022-05-18 DIAGNOSIS — Z124 Encounter for screening for malignant neoplasm of cervix: Secondary | ICD-10-CM | POA: Diagnosis not present

## 2022-06-13 DIAGNOSIS — J069 Acute upper respiratory infection, unspecified: Secondary | ICD-10-CM | POA: Diagnosis not present

## 2022-06-13 DIAGNOSIS — U071 COVID-19: Secondary | ICD-10-CM | POA: Diagnosis not present

## 2022-09-17 DIAGNOSIS — R222 Localized swelling, mass and lump, trunk: Secondary | ICD-10-CM | POA: Diagnosis not present

## 2022-09-17 DIAGNOSIS — Z1331 Encounter for screening for depression: Secondary | ICD-10-CM | POA: Diagnosis not present

## 2022-09-17 DIAGNOSIS — E782 Mixed hyperlipidemia: Secondary | ICD-10-CM | POA: Diagnosis not present

## 2022-09-17 DIAGNOSIS — Z1339 Encounter for screening examination for other mental health and behavioral disorders: Secondary | ICD-10-CM | POA: Diagnosis not present

## 2022-09-17 DIAGNOSIS — Z0000 Encounter for general adult medical examination without abnormal findings: Secondary | ICD-10-CM | POA: Diagnosis not present

## 2022-09-20 DIAGNOSIS — R222 Localized swelling, mass and lump, trunk: Secondary | ICD-10-CM | POA: Diagnosis not present

## 2022-09-20 DIAGNOSIS — R229 Localized swelling, mass and lump, unspecified: Secondary | ICD-10-CM | POA: Diagnosis not present

## 2022-10-24 DIAGNOSIS — G43009 Migraine without aura, not intractable, without status migrainosus: Secondary | ICD-10-CM | POA: Diagnosis not present

## 2023-03-20 DIAGNOSIS — R7302 Impaired glucose tolerance (oral): Secondary | ICD-10-CM | POA: Diagnosis not present

## 2023-03-20 DIAGNOSIS — J309 Allergic rhinitis, unspecified: Secondary | ICD-10-CM | POA: Diagnosis not present

## 2023-03-20 DIAGNOSIS — I951 Orthostatic hypotension: Secondary | ICD-10-CM | POA: Diagnosis not present

## 2023-03-20 DIAGNOSIS — F5102 Adjustment insomnia: Secondary | ICD-10-CM | POA: Diagnosis not present

## 2023-03-20 DIAGNOSIS — G43909 Migraine, unspecified, not intractable, without status migrainosus: Secondary | ICD-10-CM | POA: Diagnosis not present

## 2023-05-02 IMAGING — CT CT ABD-PELV W/ CM
2 of 5 series · 16 of 46 positions shown, 18 images · IV contrast (APPLIED)
Comparison: 12/05/2020

CLINICAL DATA: Rectal pain

EXAM:
CT ABDOMEN AND PELVIS WITH CONTRAST
TECHNIQUE: Multidetector CT imaging of the abdomen and pelvis was performed
using the standard protocol following bolus administration of
intravenous contrast.

[Series 2: abd pel w · axial · 0.86mm/px · z∈[+725,+1150]mm · 13 of 96 slices shown, 15 images]
[im 6/96  soft-tissue]
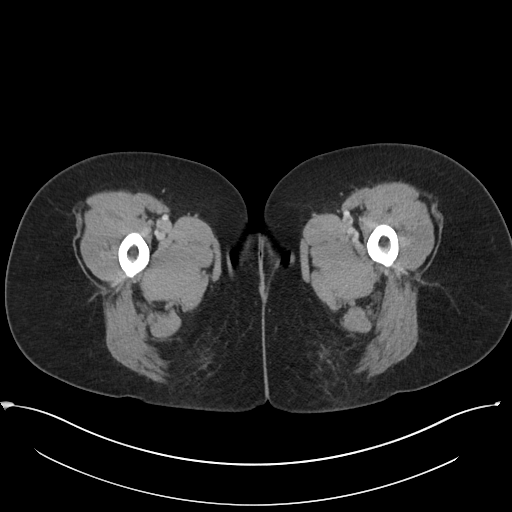
[im 6/96  bone]
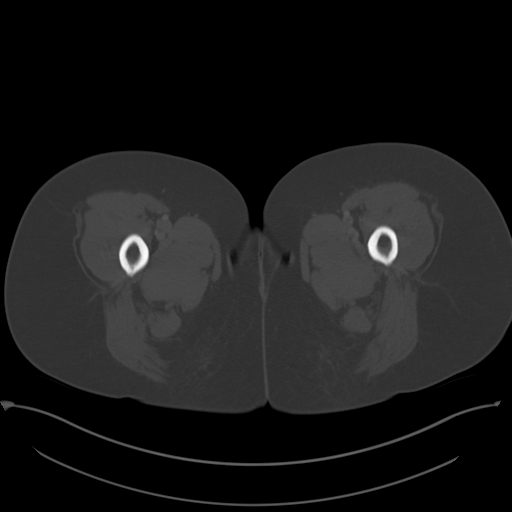
[im 16/96  soft-tissue]
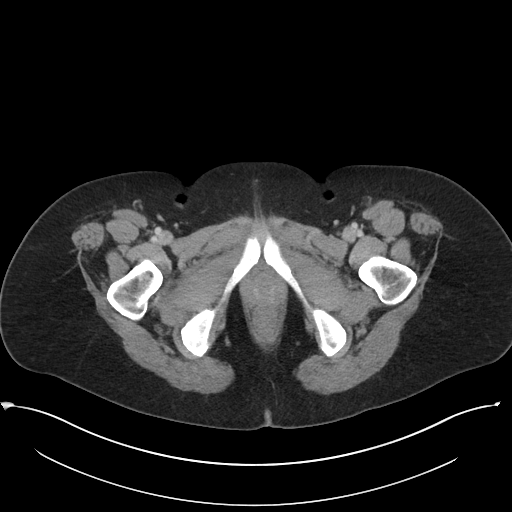
[im 21/96  soft-tissue]
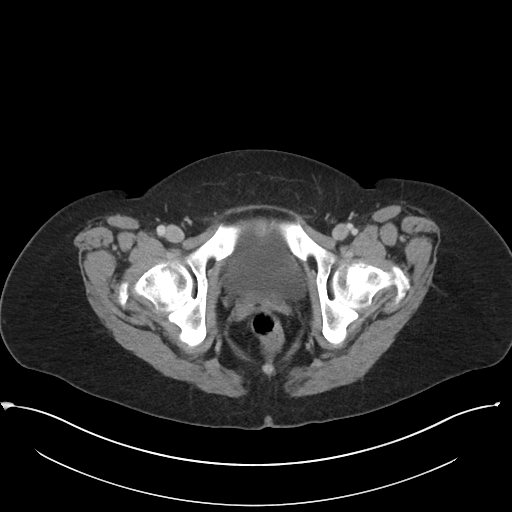
[im 26/96  soft-tissue]
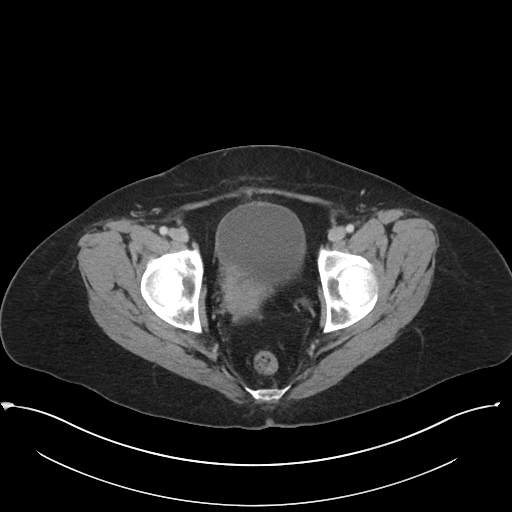
[im 36/96  soft-tissue]
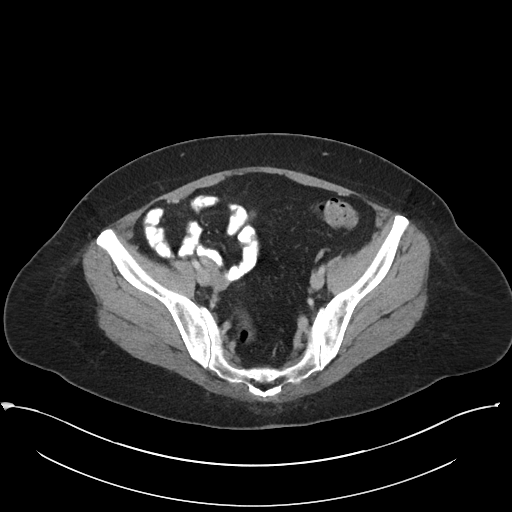
[im 41/96  soft-tissue]
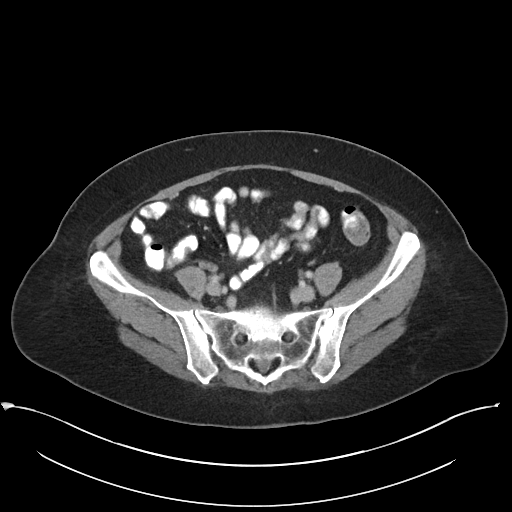
[im 51/96  soft-tissue]
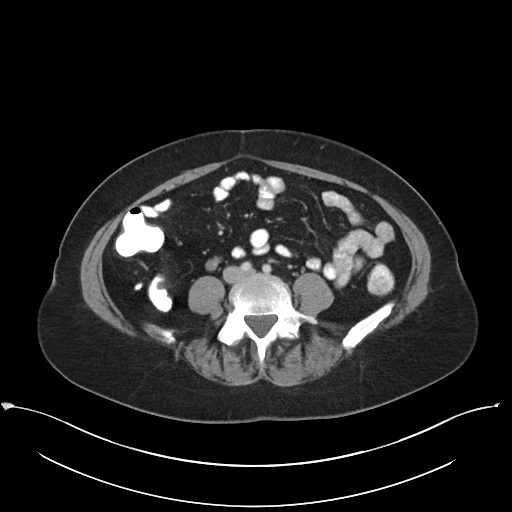
[im 56/96  soft-tissue]
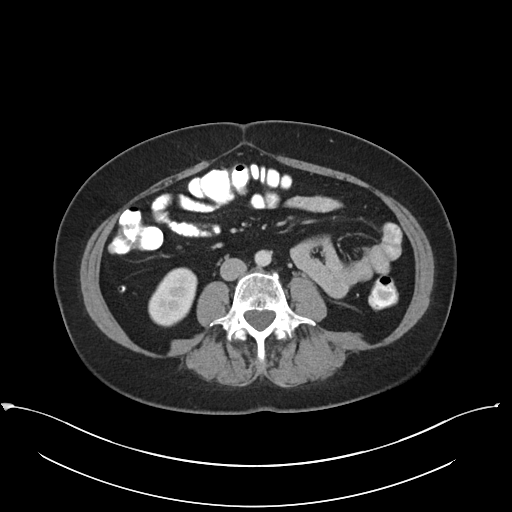
[im 61/96  soft-tissue]
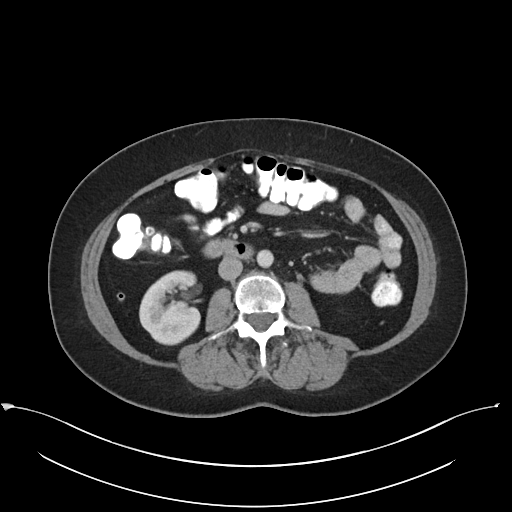
[im 61/96  bone]
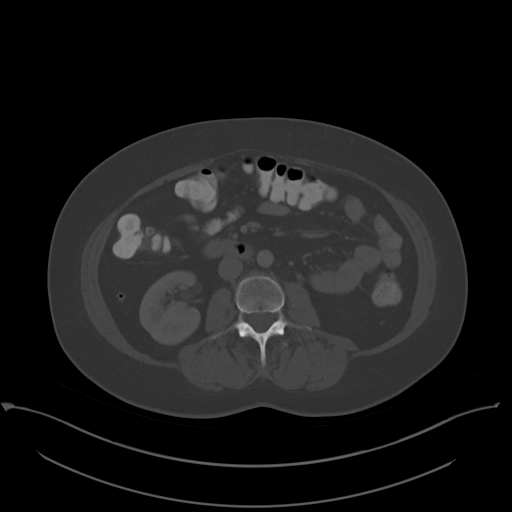
[im 71/96  soft-tissue]
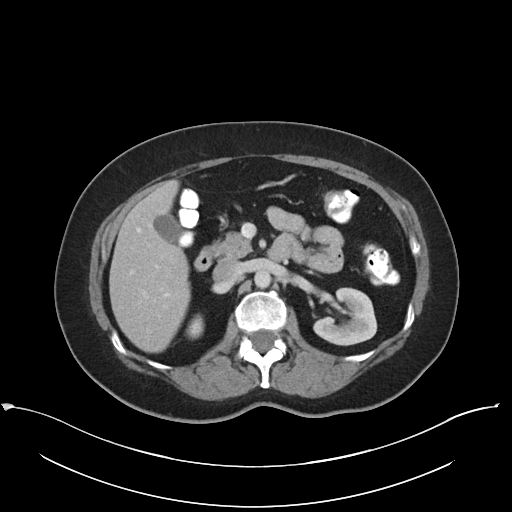
[im 76/96  soft-tissue]
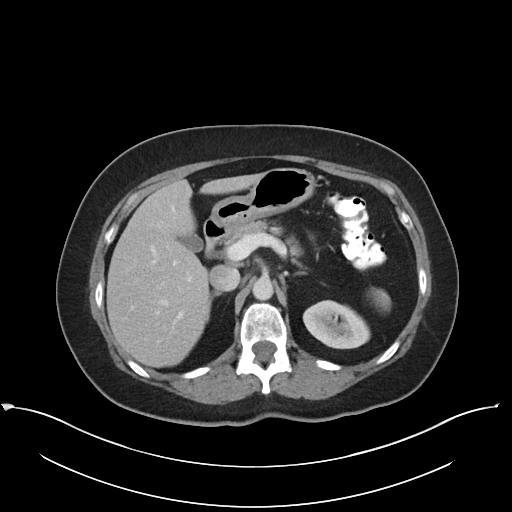
[im 81/96  soft-tissue]
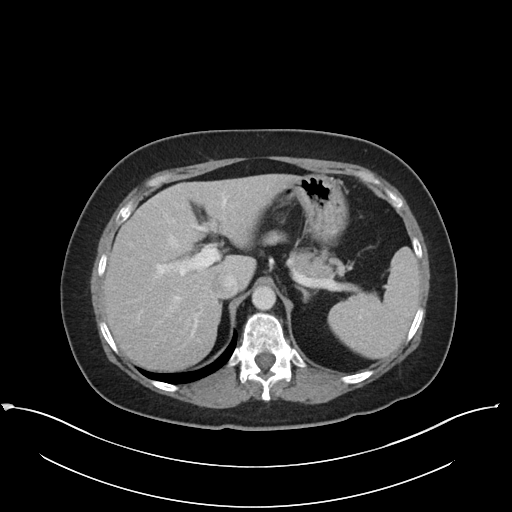
[im 91/96  soft-tissue]
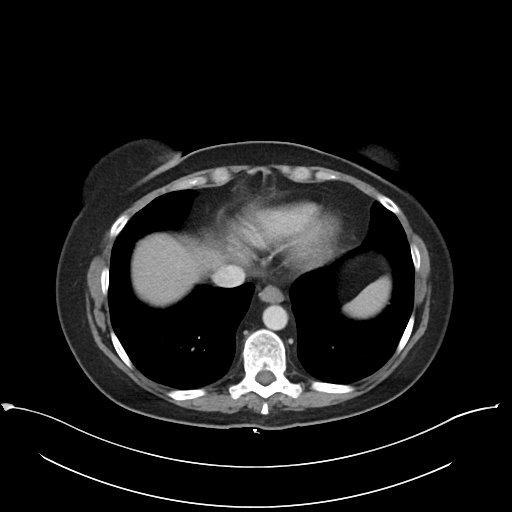

[Series 5: coronal · coronal · 0.79mm/px · 3 of 99 slices shown]
[im 33/99  soft-tissue]
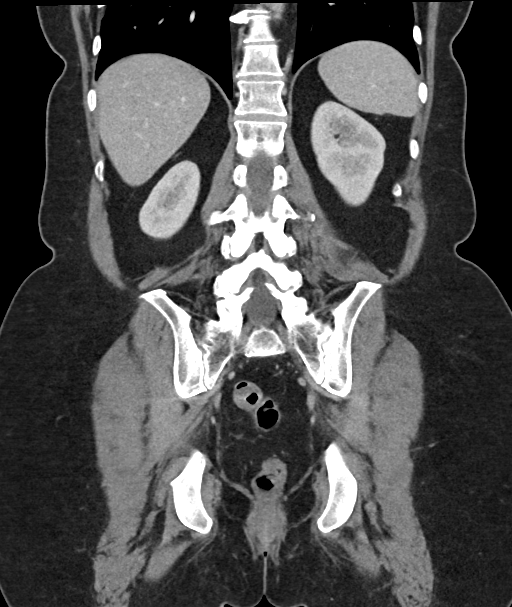
[im 44/99  soft-tissue]
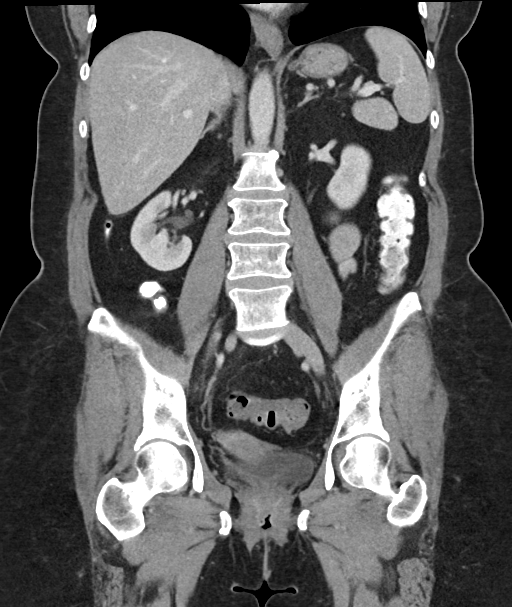
[im 55/99  soft-tissue]
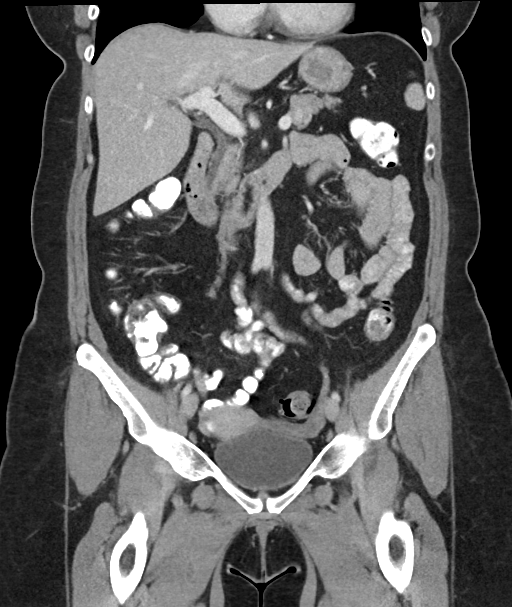

[16 of 46 positions shown; findings below may reference images not displayed]

RADIATION DOSE REDUCTION: This exam was performed according to the
departmental dose-optimization program which includes automated
exposure control, adjustment of the mA and/or kV according to
patient size and/or use of iterative reconstruction technique.

CONTRAST:  85mL OMNIPAQUE IOHEXOL 300 MG/ML  SOLN
FINDINGS: Lower chest: Unremarkable.

Hepatobiliary: No focal abnormality is seen in the liver.
Gallbladder is not distended.

Pancreas: No focal abnormality is seen.

Spleen: Unremarkable.

Adrenals/Urinary Tract: Adrenals are unremarkable. There is no
hydronephrosis. There are no renal or ureteral stones. Urinary
bladder is unremarkable.

Stomach/Bowel: Stomach is unremarkable. Small bowel loops are not
dilated. Appendix is not dilated. There is no significant wall
thickening in colon. There is no pericolic stranding. Few
diverticula are seen in the colon without signs of focal
diverticulitis. Perirectal and perianal soft tissues are
unremarkable.

Vascular/Lymphatic: Unremarkable.

Reproductive: Uterus is to the right of midline. There are no
adnexal masses.

Other: There is no ascites or pneumoperitoneum. Small umbilical
hernia containing fat is seen.

Musculoskeletal: Unremarkable.
IMPRESSION: There is no evidence of intestinal obstruction or pneumoperitoneum.
There is no hydronephrosis. Appendix is not dilated.

Diverticulosis of colon without signs of focal diverticulitis.

Other findings as described in the body of the report.

## 2023-05-06 DIAGNOSIS — I951 Orthostatic hypotension: Secondary | ICD-10-CM | POA: Diagnosis not present

## 2023-05-06 DIAGNOSIS — L729 Follicular cyst of the skin and subcutaneous tissue, unspecified: Secondary | ICD-10-CM | POA: Diagnosis not present

## 2023-05-06 DIAGNOSIS — Z6829 Body mass index (BMI) 29.0-29.9, adult: Secondary | ICD-10-CM | POA: Diagnosis not present

## 2023-05-06 DIAGNOSIS — G43909 Migraine, unspecified, not intractable, without status migrainosus: Secondary | ICD-10-CM | POA: Diagnosis not present

## 2023-07-24 DIAGNOSIS — Z6830 Body mass index (BMI) 30.0-30.9, adult: Secondary | ICD-10-CM | POA: Diagnosis not present

## 2023-07-24 DIAGNOSIS — L729 Follicular cyst of the skin and subcutaneous tissue, unspecified: Secondary | ICD-10-CM | POA: Diagnosis not present

## 2023-07-24 DIAGNOSIS — S63651A Sprain of metacarpophalangeal joint of left index finger, initial encounter: Secondary | ICD-10-CM | POA: Diagnosis not present

## 2023-08-22 DIAGNOSIS — Z01419 Encounter for gynecological examination (general) (routine) without abnormal findings: Secondary | ICD-10-CM | POA: Diagnosis not present

## 2023-08-22 DIAGNOSIS — Z1231 Encounter for screening mammogram for malignant neoplasm of breast: Secondary | ICD-10-CM | POA: Diagnosis not present

## 2023-09-05 DIAGNOSIS — L729 Follicular cyst of the skin and subcutaneous tissue, unspecified: Secondary | ICD-10-CM | POA: Diagnosis not present

## 2023-09-10 DIAGNOSIS — L309 Dermatitis, unspecified: Secondary | ICD-10-CM | POA: Diagnosis not present

## 2023-09-10 DIAGNOSIS — Z6830 Body mass index (BMI) 30.0-30.9, adult: Secondary | ICD-10-CM | POA: Diagnosis not present

## 2023-09-20 DIAGNOSIS — F5102 Adjustment insomnia: Secondary | ICD-10-CM | POA: Diagnosis not present

## 2023-09-20 DIAGNOSIS — E559 Vitamin D deficiency, unspecified: Secondary | ICD-10-CM | POA: Diagnosis not present

## 2023-09-20 DIAGNOSIS — L728 Other follicular cysts of the skin and subcutaneous tissue: Secondary | ICD-10-CM | POA: Diagnosis not present

## 2023-09-20 DIAGNOSIS — L729 Follicular cyst of the skin and subcutaneous tissue, unspecified: Secondary | ICD-10-CM | POA: Diagnosis not present

## 2023-09-20 DIAGNOSIS — E782 Mixed hyperlipidemia: Secondary | ICD-10-CM | POA: Diagnosis not present

## 2023-09-20 DIAGNOSIS — Z79899 Other long term (current) drug therapy: Secondary | ICD-10-CM | POA: Diagnosis not present

## 2023-09-20 DIAGNOSIS — D234 Other benign neoplasm of skin of scalp and neck: Secondary | ICD-10-CM | POA: Diagnosis not present

## 2023-10-09 DIAGNOSIS — D239 Other benign neoplasm of skin, unspecified: Secondary | ICD-10-CM | POA: Diagnosis not present

## 2023-10-09 DIAGNOSIS — Z09 Encounter for follow-up examination after completed treatment for conditions other than malignant neoplasm: Secondary | ICD-10-CM | POA: Diagnosis not present

## 2023-10-31 DIAGNOSIS — N951 Menopausal and female climacteric states: Secondary | ICD-10-CM | POA: Diagnosis not present

## 2023-10-31 DIAGNOSIS — Z6830 Body mass index (BMI) 30.0-30.9, adult: Secondary | ICD-10-CM | POA: Diagnosis not present

## 2023-10-31 DIAGNOSIS — G43909 Migraine, unspecified, not intractable, without status migrainosus: Secondary | ICD-10-CM | POA: Diagnosis not present

## 2024-03-08 ENCOUNTER — Encounter (HOSPITAL_BASED_OUTPATIENT_CLINIC_OR_DEPARTMENT_OTHER): Payer: Self-pay

## 2024-03-08 ENCOUNTER — Ambulatory Visit (HOSPITAL_BASED_OUTPATIENT_CLINIC_OR_DEPARTMENT_OTHER)
Admission: RE | Admit: 2024-03-08 | Discharge: 2024-03-08 | Disposition: A | Discharge status: Home / Self Care | Attending: Family Medicine | Admitting: Family Medicine

## 2024-03-08 ENCOUNTER — Ambulatory Visit (INDEPENDENT_AMBULATORY_CARE_PROVIDER_SITE_OTHER): Admit: 2024-03-08 | Discharge: 2024-03-08 | Disposition: A | Discharge status: Home / Self Care | Admitting: Radiology

## 2024-03-08 VITALS — BP 133/86 | HR 72 | Temp 97.5°F | Resp 18

## 2024-03-08 DIAGNOSIS — W010XXA Fall on same level from slipping, tripping and stumbling without subsequent striking against object, initial encounter: Secondary | ICD-10-CM

## 2024-03-08 DIAGNOSIS — M79672 Pain in left foot: Secondary | ICD-10-CM

## 2024-03-08 DIAGNOSIS — S92152A Displaced avulsion fracture (chip fracture) of left talus, initial encounter for closed fracture: Secondary | ICD-10-CM | POA: Diagnosis not present

## 2024-03-08 DIAGNOSIS — M25572 Pain in left ankle and joints of left foot: Secondary | ICD-10-CM

## 2024-03-08 DIAGNOSIS — S92102A Unspecified fracture of left talus, initial encounter for closed fracture: Secondary | ICD-10-CM

## 2024-03-08 MED ORDER — OXYCODONE HCL 5 MG PO TABS: 5.0000 mg | ORAL_TABLET | ORAL | 0 refills | Status: AC | PRN

## 2024-03-08 NOTE — ED Provider Notes (Addendum)
 [facility]    CSN: [id number] Arrival date & time: 03/08/24  0806      History   Chief Complaint Chief Complaint  Patient presents with   Fall    Left ankle - Entered by patient   Ankle Pain   Foot Injury    HPI Brittany Thomas is a 58 y.o. female.   58 year old female who is well-known to me from my former family practice.  She twisted her left foot and ankle yesterday morning (03/07/2024).  She has significant bruising on the midfoot and laterally from the heel to the midfoot with swelling of the ankle and the midfoot.  She has pain with weightbearing.  She is concerned that she might have broken a bone instead of just sprained her ankle.  Her ankle throbs at rest but is much worse when she puts weight on it.   Fall Pertinent negatives include no chest pain and no abdominal pain.    Past Medical History:  Diagnosis Date   Allergic rhinitis    Allergy    Arthralgia of multiple sites    Asthma    Bronchitis    Calcium nephrolithiasis    Dermatitis herpetiformis    Diverticulitis    Diverticulitis    Diverticulosis    GERD (gastroesophageal reflux disease)    Hyperlipidemia    IBS (irritable bowel syndrome)    Migraine    Recurrent herpes simplex    Rhinorrhea    Superficial varicosities    Right   Vitamin D deficiency     There are no active problems to display for this patient.   Past Surgical History:  Procedure Laterality Date   BREAST BIOPSY     CESAREAN SECTION     x2   COLONOSCOPY  02/03/2009   Mild pancolonic diverticulosis. Otherwise normal colonoscopy   ESOPHAGOGASTRODUODENOSCOPY  05/23/2011   Distal esophageal stricture, status post esophageal dilatation. Incidental gastic polyps (status post polypectomy previously deemed to be fundic gland polyps). Mild gastroduodenitis.   TUBAL LIGATION Bilateral     OB History   No obstetric history on file.      Home Medications    Prior to Admission medications   Medication Sig  Start Date End Date Taking? Authorizing Provider  oxyCODONE  (ROXICODONE ) 5 MG immediate release tablet Take 1 tablet (5 mg total) by mouth every 4 (four) hours as needed for severe pain (pain score 7-10). 03/08/24  Yes Ival Domino, FNP  albuterol (VENTOLIN HFA) 108 (90 Base) MCG/ACT inhaler albuterol sulfate HFA 90 mcg/actuation aerosol inhaler    [provider]  cetirizine (ZYRTEC) 10 MG tablet Take 10 mg by mouth daily.    [provider]  cholecalciferol (VITAMIN D) 400 UNITS TABS Take 400 Units by mouth daily.    [provider]  cyclobenzaprine (FLEXERIL) 5 MG tablet Take 5 mg by mouth daily as needed. For pain Patient not taking: Reported on 03/16/2021    [provider]  ibuprofen  (ADVIL ,MOTRIN ) 600 MG tablet ibuprofen  600 mg tablet    [provider]  lidocaine (LIDODERM) 5 % 1 patch daily as needed. Patient not taking: Reported on 03/16/2021 12/05/20   [provider]  loratadine (CLARITIN) 10 MG tablet Take 10 mg by mouth daily. Patient not taking: Reported on 03/16/2021    [provider]  metroNIDAZOLE (FLAGYL) 500 MG tablet metronidazole 500 mg tablet Patient not taking: Reported on 03/16/2021    [provider]  pantoprazole  (PROTONIX ) 20 MG tablet TAKE  1 TABLET BY MOUTH EVERY DAY 04/03/21   Charlanne Groom, MD  Probiotic Product (PROBIOTIC DAILY PO) Take 1 capsule by mouth daily.    [provider]  propranolol (INDERAL) 60 MG tablet Take 60 mg by mouth 3 (three) times daily.    [provider]  valACYclovir (VALTREX) 1000 MG tablet valacyclovir 1 gram tablet Patient not taking: Reported on 03/16/2021    [provider]    Family History History reviewed. No pertinent family history.  Social History Social History   Tobacco Use   Smoking status: Never   Smokeless tobacco: Never  Vaping Use   Vaping status: Never Used  Substance Use Topics   Alcohol use: No      Allergies   Penicillins   Review of Systems Review of Systems  Constitutional:  Negative for fever.  Respiratory:  Negative for cough.   Cardiovascular:  Negative for chest pain.  Gastrointestinal:  Negative for abdominal pain, constipation, diarrhea, nausea and vomiting.  Musculoskeletal:  Positive for gait problem (Pain with weightbearing and walking on the left foot.) and joint swelling (Left foot and left ankle pain and swelling at the midfoot and laterally from the midfoot to the heel). Negative for arthralgias and back pain.  Skin:  Negative for color change and rash.  Neurological:  Negative for syncope.  All other systems reviewed and are negative.    Physical Exam Triage Vital Signs ED Triage Vitals  Encounter Vitals Group     BP      Girls Systolic BP Percentile      Girls Diastolic BP Percentile      Boys Systolic BP Percentile      Boys Diastolic BP Percentile      Pulse      Resp      Temp      Temp src      SpO2      Weight      Height      Head Circumference      Peak Flow      Pain Score      Pain Loc      Pain Education      Exclude from Growth Chart    No data found.  Updated Vital Signs BP 133/86 (BP Location: Right Arm)   Pulse 72   Temp (!) 97.5 F (36.4 C) (Oral)   Resp 18   SpO2 98%   Visual Acuity Right Eye Distance:   Left Eye Distance:   Bilateral Distance:    Right Eye Near:   Left Eye Near:    Bilateral Near:     Physical Exam Vitals and nursing note reviewed.  Constitutional:      General: She is not in acute distress.    Appearance: She is well-developed. She is not ill-appearing or toxic-appearing.  HENT:     Head: Normocephalic and atraumatic.     Right Ear: External ear normal.     Left Ear: External ear normal.     Nose: Nose normal.     Mouth/Throat:     Lips: Pink.     Mouth: Mucous membranes are moist.  Eyes:     Conjunctiva/sclera: Conjunctivae normal.     Pupils: Pupils are equal, round, and  reactive to light.  Cardiovascular:     Rate and Rhythm: Normal rate and regular rhythm.     Pulses:          Dorsalis pedis pulses are 2+ on the  left side.       Posterior tibial pulses are 2+ on the left side.     Heart sounds: S1 normal and S2 normal. No murmur heard. Pulmonary:     Effort: Pulmonary effort is normal. No respiratory distress.     Breath sounds: Normal breath sounds. No decreased breath sounds, wheezing, rhonchi or rales.  Musculoskeletal:        General: No swelling.     Right knee: Normal.     Left knee: Normal.     Right lower leg: Normal.     Left lower leg: Normal.     Right ankle: Normal.     Left ankle: Swelling and ecchymosis present. No deformity or lacerations. Tenderness present over the lateral malleolus. Decreased range of motion (Decreased range of motion due to pain, swelling). Anterior drawer test negative. Normal pulse.     Left Achilles Tendon: Normal.     Right foot: Normal.     Left foot: Decreased range of motion (Decreased range of motion due to pain and swelling). Normal capillary refill. Swelling (Midfoot centrally and laterally with swelling extending from the midfoot to the heel laterally.  She also has ecchymosis in all those same areas.), tenderness (Soft tissue tenderness from the lateral heel all the way to the forefoot with most of the tenderness at the midfoot centrally and laterally but some tenderness laterally below the ankle.) and bony tenderness (The bones of the forefoot centrally and laterally and around the lateral ankle) present. No deformity, bunion, Charcot foot, foot drop, prominent metatarsal heads, laceration or crepitus. Normal pulse.     Comments: See photos for more information.  Skin:    General: Skin is warm and dry.     Capillary Refill: Capillary refill takes less than 2 seconds.     Findings: No rash.  Neurological:     Mental Status: She is alert and oriented to person, place, and time.  Psychiatric:        Mood  and Affect: Mood normal.         UC Treatments / Results  Labs (all labs ordered are listed, but only abnormal results are displayed) Labs Reviewed - No data to display  EKG   Radiology DG Foot Complete Left Result Date: 03/08/2024 EXAM: 3 or more VIEW(S) XRAY OF THE left FOOT COMPARISON: 04/18/2018. CLINICAL HISTORY: Left foot and ankle pain FINDINGS: BONES AND JOINTS: Tiny avulsion fracture is seen from the dorsal aspect of the talus. Ossifications at the Achilles tendon insertion site. SOFT TISSUES: Mild dorsal soft tissue swelling of the foot. IMPRESSION: 1. Tiny avulsion fracture from the dorsal aspect of the talus. 2. Ossifications at the Achilles tendon insertion site. Electronically signed by: Norleen Kil MD 03/08/2024 09:09 AM EST RP Workstation: [id number]    Procedures Procedures (including critical care time)  Medications Ordered in UC Medications - No data to display  Initial Impression / Assessment and Plan / UC Course  I have reviewed the triage vital signs and the nursing notes.  Pertinent labs & imaging results that were available during my care of the patient were reviewed by me and considered in my medical decision making (see chart for details).  Plan of Care: Nondisplaced fracture of the small bone of the forefoot with pain and swelling after a fall: Needs a postop shoe.  Provided pain medication (oxycodone  5 mg every 6 hours if needed for pain.).  May use acetaminophen  or ibuprofen  as directed on the package for pain  if needed.  Encouraged RICE therapy.    Follow-up with orthopedics as needed.  Return here as needed.  I reviewed the plan of care with the patient and/or the patient's guardian.  The patient and/or guardian had time to ask questions and acknowledged that the questions were answered.  I provided instruction on symptoms or reasons to return here or to go to an ER, if symptoms/condition did not improve, worsened or if new symptoms occurred.  Final  Clinical Impressions(s) / UC Diagnoses   Final diagnoses:  Acute left ankle pain  Left foot pain  Fall on same level from slipping, tripping or stumbling, initial encounter  Closed displaced fracture of left talus, unspecified portion of talus, initial encounter     Discharge Instructions      Nondisplaced fracture of the small bone of the forefoot with pain and swelling after a fall: Needs a postop shoe.  Provided pain medication (oxycodone  5 mg every 6 hours if needed for pain.).  May use acetaminophen  or ibuprofen  as directed on the package for pain if needed.  Encouraged RICE therapy.    Follow-up with orthopedics as needed.  Return here as needed.     ED Prescriptions     Medication Sig Dispense Auth. Provider   oxyCODONE  (ROXICODONE ) 5 MG immediate release tablet Take 1 tablet (5 mg total) by mouth every 4 (four) hours as needed for severe pain (pain score 7-10). 10 tablet Early Ord, FNP      I have reviewed the PDMP during this encounter.   Ival Domino, FNP 03/08/24 [id number]    Ival Domino, FNP 03/08/24 0930    Ival Domino, FNP 03/08/24 1026

## 2024-03-08 NOTE — Discharge Instructions (Addendum)
 Nondisplaced fracture of the small bone of the forefoot with pain and swelling after a fall: Needs a postop shoe.  Provided pain medication (oxycodone  5 mg every 6 hours if needed for pain.).  May use acetaminophen  or ibuprofen  as directed on the package for pain if needed.  Encouraged RICE therapy.    Follow-up with orthopedics as needed.  Return here as needed.

## 2024-03-08 NOTE — ED Triage Notes (Signed)
 Fell yesterday morning Left foot ankle/foot is blue/purple Missed the step and fell, heard a pop Denied hitting head

## 2024-03-10 ENCOUNTER — Ambulatory Visit

## 2024-03-10 DIAGNOSIS — S92102A Unspecified fracture of left talus, initial encounter for closed fracture: Secondary | ICD-10-CM | POA: Diagnosis not present

## 2024-03-10 DIAGNOSIS — S93492A Sprain of other ligament of left ankle, initial encounter: Secondary | ICD-10-CM

## 2024-03-11 NOTE — Progress Notes (Signed)
 Subjective:  Patient ID: Brittany Thomas, female    DOB: 03/14/1966,  MRN: [id number]  Chief Complaint  Patient presents with   Fracture    Closed displaced fracture of left talus, unspecified portion of talus    Discussed the use of AI scribe software for clinical note transcription with the patient, who gave verbal consent to proceed.  History of Present Illness Brittany Thomas is a 58 year old female who presents with a left ankle injury sustained from a fall.  She fell on Saturday morning at approximately 6:45 AM after missing a step while carrying items for a church bazaar. She initially applied ice and attempted to rest the ankle but continued to experience significant pain and swelling. She sought medical attention at urgent care on Sunday, where she was informed of a possible fracture.  She describes pain with occasional numbness in her toes, likely due to swelling. She recalls hearing a 'pop' at the time of the fall. The pain is primarily located in the ankle and foot, with some soreness extending upwards. Current symptoms include significant swelling and soreness in the left ankle, with numbness in the toes. She experiences discomfort when attempting to walk.  She works at the [employer], primarily on a community education officer, and is concerned about her ability to perform her duties given her current condition. She has discussed the possibility of seated work to accommodate her injury.     Review of Systems: Negative except as noted in the HPI. Denies N/V/F/Ch.  Past Medical History:  Diagnosis Date   Allergic rhinitis    Allergy    Arthralgia of multiple sites    Asthma    Bronchitis    Calcium nephrolithiasis    Dermatitis herpetiformis    Diverticulitis    Diverticulitis    Diverticulosis    GERD (gastroesophageal reflux disease)    Hyperlipidemia    IBS (irritable bowel syndrome)    Migraine    Recurrent herpes simplex    Rhinorrhea    Superficial varicosities     Right   Vitamin D deficiency     Current Outpatient Medications:    albuterol (VENTOLIN HFA) 108 (90 Base) MCG/ACT inhaler, albuterol sulfate HFA 90 mcg/actuation aerosol inhaler, Disp: , Rfl:    cetirizine (ZYRTEC) 10 MG tablet, Take 10 mg by mouth daily., Disp: , Rfl:    cholecalciferol (VITAMIN D) 400 UNITS TABS, Take 400 Units by mouth daily., Disp: , Rfl:    cyclobenzaprine (FLEXERIL) 5 MG tablet, Take 5 mg by mouth daily as needed. For pain (Patient not taking: Reported on 03/16/2021), Disp: , Rfl:    ibuprofen  (ADVIL ,MOTRIN ) 600 MG tablet, ibuprofen  600 mg tablet, Disp: , Rfl:    lidocaine (LIDODERM) 5 %, 1 patch daily as needed. (Patient not taking: Reported on 03/16/2021), Disp: , Rfl:    loratadine (CLARITIN) 10 MG tablet, Take 10 mg by mouth daily. (Patient not taking: Reported on 03/16/2021), Disp: , Rfl:    metroNIDAZOLE (FLAGYL) 500 MG tablet, metronidazole 500 mg tablet (Patient not taking: Reported on 03/16/2021), Disp: , Rfl:    oxyCODONE  (ROXICODONE ) 5 MG immediate release tablet, Take 1 tablet (5 mg total) by mouth every 4 (four) hours as needed for severe pain (pain score 7-10)., Disp: 10 tablet, Rfl: 0   pantoprazole  (PROTONIX ) 20 MG tablet, TAKE 1 TABLET BY MOUTH EVERY DAY, Disp: 90 tablet, Rfl: 4   Probiotic Product (PROBIOTIC DAILY PO), Take 1 capsule by mouth daily., Disp: , Rfl:  propranolol (INDERAL) 60 MG tablet, Take 60 mg by mouth 3 (three) times daily., Disp: , Rfl:    valACYclovir (VALTREX) 1000 MG tablet, valacyclovir 1 gram tablet (Patient not taking: Reported on 03/16/2021), Disp: , Rfl:   Current Facility-Administered Medications:    triamcinolone  acetonide (KENALOG ) 10 MG/ML injection 10 mg, 10 mg, Other, Once, [place], [place], DPM  Social History   Tobacco Use  Smoking Status Never  Smokeless Tobacco Never    Allergies  Allergen Reactions   Penicillins     rash   Objective:   Constitutional Well developed. Well nourished. Oriented to  person, place, and time.  Vascular Dorsalis pedis pulses faintly palpable on the left, likely secondary to swelling . Posterior tibial pulses palpable bilaterally. Capillary refill normal to all digits.  No cyanosis or clubbing noted. Pedal hair growth normal. Ecchymosis and edema circumferential around the ankle and hindfoot  Neurologic Normal speech. Epicritic sensation to light touch grossly intact bilaterally. Negative tinel sign at tarsal tunnel bilaterally.   Dermatologic Skin texture and turgor are within normal limits.  No open wounds. No skin lesions.  Musculoskeletal: 5 out of 5 muscle strength all major pedal muscle groups.  Moderate pain to palpation dorsal talus.  Pain to palpation upon maximal plantarflexion.  Minimal pain with ankle, STJ and TN joint range of motion.  No pain to palpation of fibula or medial malleolus.   Radiographs: Previously taken x-rays from outside facility were reviewed today.  This does show a very small avulsion fracture off the dorsal talar neck.  Extra-articular.  Normal bone density.  No other acute osseous findings.  Assessment:   1. Closed nondisplaced fracture of left talus, unspecified portion of talus, initial encounter   2. Sprain of anterior talofibular ligament of left ankle, initial encounter      Plan:  Patient was evaluated and treated and all questions answered.  Assessment and Plan Assessment & Plan Left talus avulsion fracture and left ankle sprain Swelling and numbness likely due to swelling. Concurrent ankle sprain suspected. - Advised weight-bearing as tolerated with plastic boot. We provided her with a walking boot today to stabilize the area.  - Recommended icing and elevating foot to reduce swelling. - Applied Ace Wrap for compression. - She can perform seated work as needed, elevate her leg while seated.  She is unable to perform ambulatory work for the next 3 to 4 weeks. - Scheduled follow-up in 6-7 weeks to assess  healing.   Prentice Ovens, DPM AACFAS Fellowship Trained Podiatric Surgeon [facility]

## 2024-04-21 ENCOUNTER — Encounter: Payer: Self-pay | Admitting: Podiatry

## 2024-04-21 ENCOUNTER — Ambulatory Visit: Admitting: Podiatry

## 2024-04-21 ENCOUNTER — Ambulatory Visit

## 2024-04-21 DIAGNOSIS — S92102A Unspecified fracture of left talus, initial encounter for closed fracture: Secondary | ICD-10-CM

## 2024-04-21 DIAGNOSIS — S8262XD Displaced fracture of lateral malleolus of left fibula, subsequent encounter for closed fracture with routine healing: Secondary | ICD-10-CM | POA: Diagnosis not present

## 2024-04-21 NOTE — Progress Notes (Unsigned)
°  Subjective:  Patient ID: Brittany Thomas, female    DOB: 1965-10-05,  MRN: [id number]  Chief Complaint  Patient presents with   Fracture    Left talus FX follow up.  Doing a little better, she did get 3 weeks in the cam walker but she was unsteady.  Still has pain at times, with stretching     Discussed the use of AI scribe software for clinical note transcription with the patient, who gave verbal consent to proceed.  History of Present Illness Brittany Thomas is a 58 year old female who presents with left foot and ankle pain following a previous injury.  She reports 85-90% improvement since the injury but still has residual pain, mainly over the lateral ankle. She notes soreness with dorsiflexion, over the lateral malleolus, in the anterior ankle, and with inversion or lying on the lateral side of the foot.  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 She occasionally takes Tylenol  and wears compression socks that help with swelling and support. She has returned to full-duty work for the past couple of weeks, though some activities remain painful.      Objective:    Physical Exam MUSCULOSKELETAL: Left ankle exhibits mild pain on dorsiflexion and movement, with direct tenderness over the bone and mild tenderness in front of the finger bone. Left foot shows mild pain on inversion and eversion.   No images are attached to the encounter.    Results Radiology Left ankle radiographs: Healing fracture of the left talus with consolidation and prominence at the site of initial injury; healing fracture of the left distal fibula with good alignment; faint fracture line present; no evidence of displacement; findings consistent with approximately ninety percent healing. (Independently interpreted)   Assessment:   1. Closed nondisplaced fracture of left talus, unspecified portion of talus, initial encounter       Plan:  Patient was evaluated and treated and all questions answered.  Assessment and Plan Assessment & Plan Healing fracture of left talus and distal fibula with associated left ankle sprain Fracture 90% healed with good alignment. Reports 85-90% symptom improvement. Stable fracture. Possible mild displaced fibula fracture initially missed. Discomfort during twisting activities. - Continue walking boot 2-4 weeks, especially during sleep. - Transition to ankle brace for stability and movement. - Incorporate home ankle stretching exercises. - Use OTC Tylenol , ibuprofen , or naproxen  for pain. - Apply RICE therapy as needed. - Consider physical therapy if pain persists or worsens.      Return in about 4 weeks (around 05/19/2024) for Talus fracture xr.

## 2024-05-19 ENCOUNTER — Ambulatory Visit: Admitting: Podiatry

## 2024-05-19 ENCOUNTER — Ambulatory Visit (INDEPENDENT_AMBULATORY_CARE_PROVIDER_SITE_OTHER)

## 2024-05-19 DIAGNOSIS — S93492D Sprain of other ligament of left ankle, subsequent encounter: Secondary | ICD-10-CM

## 2024-05-19 DIAGNOSIS — S92102A Unspecified fracture of left talus, initial encounter for closed fracture: Secondary | ICD-10-CM | POA: Diagnosis not present

## 2024-05-19 DIAGNOSIS — S8262XD Displaced fracture of lateral malleolus of left fibula, subsequent encounter for closed fracture with routine healing: Secondary | ICD-10-CM

## 2024-05-19 NOTE — Progress Notes (Unsigned)
 "  Subjective:  Patient ID: Brittany Thomas, female    DOB: 09-26-1965,  MRN: [id number]  Chief Complaint  Patient presents with   Fracture    Left talus FX, Shooting pain from the outside into the inside. She has been using volteran and advil .  She does feel like something is not right. Working 12 hour shift at [employer]. Still cannot turn foot medially.     Discussed the use of AI scribe software for clinical note transcription with the patient, who gave verbal consent to proceed.  History of Present Illness Brittany Thomas is a 59 year old female with left talus and fibula ankle avulsion fractures and ligamentous injury who presents for follow-up of persistent left ankle pain and instability.  Since the injury 2 months ago beginning of November, she has had ongoing left ankle pain and a feeling of instability.  She describes intermittent shooting pain radiating toward the toes and nocturnal throbbing that sometimes wakes her. Pain is provoked by inversion and eversion, worse with resistive eversion, and is greater with plantarflexion than dorsiflexion.  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 An ankle brace provides significant pain relief and stability, and she notes increased pain and instability when not wearing it, especially after removing it at home. She discontinued the walking boot earlier than recommended. She is considering a larger compression sleeve due to stretching of the current one.  She uses topical Voltaren gel with partial relief and takes ibuprofen  or Aleve  as needed but sparingly. She has not been icing the ankle. She is interested in strengthening and stabilizing exercises and is considering physical therapy but is concerned about fitting it around her work schedule. She remains concerned about ongoing instability and weakness.      Objective:    Physical Exam EXTREMITIES: DP and PT Pulse intact. Mild localized perimalleolar  edema. Tenderness on palpation of ATFL and CFL regions left ankle Pain with resisted inversion and eversion. Decreased tenderness over distal fibula Pedal skin well-hydrated within normal limits for skin texture and skin turgor. Light touch sensation intact   No images are attached to the encounter.    Results Radiology Left ankle X-ray (05/19/2024): Evidence of healing and fracture remodeling about talar neck avulsion fracture and distal fibula avulsion fracture with complete osseous bridging noted.  Ankle alignment maintained.  (Independently interpreted)   Assessment:   1. Closed avulsion fracture of lateral malleolus of left fibula with routine healing, subsequent encounter   2. Closed nondisplaced fracture of left talus, unspecified portion of talus, initial encounter   3. Sprain of anterior talofibular ligament of left ankle, subsequent encounter      Plan:  Patient was evaluated and treated and all questions answered.  Assessment and Plan Assessment & Plan Left ankle avulsion fractures and ligamentous injury, routine healing Over two months post-injury with closed nondisplaced avulsion fractures of the left talus and lateral malleolus showing routine healing. Persistent pain, edema, and tenderness due to ligamentous injury and possible chronic instability. Conservative management remains appropriate. - Advised continued use of ankle brace for support. - Referred to physical therapy for targeted rehabilitation and proprioceptive training. Referral placed to [facility] - Provided home exercise packet and instructed her to perform exercises two to three times daily. - Recommended ice application and topical diclofenac  (Voltaren) as needed for pain control. - Discussed use of NSAIDs (ibuprofen , naproxen ) as tolerated for pain. - Recommended obtaining a new, appropriately sized  compression sleeve if current brace is ill-fitting. - Planned follow-up in 4-6 weeks to reassess  progress after physical therapy.      Return in about 6 weeks (around 06/30/2024) for ankle sprain.    "

## 2024-05-20 ENCOUNTER — Encounter: Payer: Self-pay | Admitting: Podiatry

## 2024-06-30 ENCOUNTER — Ambulatory Visit: Admitting: Podiatry
